# Patient Record
Sex: Female | Born: 1965
Health system: Southern US, Community
[De-identification: ages and names within clinical notes are randomized; demographics above are authoritative.]

## PROBLEM LIST (undated history)

## (undated) DIAGNOSIS — S62102A Fracture of unspecified carpal bone, left wrist, initial encounter for closed fracture: Secondary | ICD-10-CM

## (undated) DIAGNOSIS — M419 Scoliosis, unspecified: Secondary | ICD-10-CM

## (undated) DIAGNOSIS — F325 Major depressive disorder, single episode, in full remission: Secondary | ICD-10-CM

## (undated) DIAGNOSIS — I8390 Asymptomatic varicose veins of unspecified lower extremity: Secondary | ICD-10-CM

## (undated) DIAGNOSIS — F419 Anxiety disorder, unspecified: Secondary | ICD-10-CM

## (undated) DIAGNOSIS — G4730 Sleep apnea, unspecified: Secondary | ICD-10-CM

## (undated) DIAGNOSIS — T8859XA Other complications of anesthesia, initial encounter: Secondary | ICD-10-CM

## (undated) DIAGNOSIS — Z9889 Other specified postprocedural states: Secondary | ICD-10-CM

## (undated) DIAGNOSIS — R112 Nausea with vomiting, unspecified: Secondary | ICD-10-CM

## (undated) HISTORY — DX: Scoliosis, unspecified: M41.9

## (undated) HISTORY — PX: PANNICULECTOMY: SUR1001

## (undated) HISTORY — DX: Asymptomatic varicose veins of unspecified lower extremity: I83.90

## (undated) HISTORY — PX: APPENDECTOMY: SHX54

## (undated) HISTORY — PX: CERVICAL FUSION: SHX112

## (undated) HISTORY — DX: Major depressive disorder, single episode, in full remission: F32.5

## (undated) HISTORY — PX: CHOLECYSTECTOMY: SHX55

---

## 1999-04-20 ENCOUNTER — Other Ambulatory Visit: Admission: RE | Admit: 1999-04-20 | Discharge: 1999-04-20 | Payer: Self-pay | Admitting: Obstetrics and Gynecology

## 2000-06-30 ENCOUNTER — Other Ambulatory Visit: Admission: RE | Admit: 2000-06-30 | Discharge: 2000-06-30 | Payer: Self-pay | Admitting: Gynecology

## 2000-06-30 ENCOUNTER — Other Ambulatory Visit: Admission: RE | Admit: 2000-06-30 | Discharge: 2000-06-30 | Payer: Self-pay | Admitting: Obstetrics and Gynecology

## 2000-10-29 ENCOUNTER — Other Ambulatory Visit: Admission: RE | Admit: 2000-10-29 | Discharge: 2000-10-29 | Payer: Self-pay | Admitting: Obstetrics and Gynecology

## 2001-05-29 ENCOUNTER — Other Ambulatory Visit: Admission: RE | Admit: 2001-05-29 | Discharge: 2001-05-29 | Payer: Self-pay | Admitting: Obstetrics and Gynecology

## 2001-06-19 ENCOUNTER — Ambulatory Visit (HOSPITAL_COMMUNITY): Admission: RE | Admit: 2001-06-19 | Discharge: 2001-06-19 | Payer: Self-pay | Admitting: Orthopaedic Surgery

## 2001-06-19 ENCOUNTER — Encounter: Payer: Self-pay | Admitting: Orthopaedic Surgery

## 2001-11-30 ENCOUNTER — Other Ambulatory Visit: Admission: RE | Admit: 2001-11-30 | Discharge: 2001-11-30 | Payer: Self-pay | Admitting: Obstetrics and Gynecology

## 2002-08-17 ENCOUNTER — Other Ambulatory Visit: Admission: RE | Admit: 2002-08-17 | Discharge: 2002-08-17 | Payer: Self-pay | Admitting: Obstetrics and Gynecology

## 2003-11-15 ENCOUNTER — Ambulatory Visit (HOSPITAL_COMMUNITY): Admission: RE | Admit: 2003-11-15 | Discharge: 2003-11-15 | Payer: Self-pay | Admitting: Emergency Medicine

## 2003-11-15 ENCOUNTER — Emergency Department (HOSPITAL_COMMUNITY): Admission: EM | Admit: 2003-11-15 | Discharge: 2003-11-15 | Payer: Self-pay | Admitting: Emergency Medicine

## 2003-11-21 ENCOUNTER — Ambulatory Visit (HOSPITAL_COMMUNITY): Admission: RE | Admit: 2003-11-21 | Discharge: 2003-11-21 | Payer: Self-pay | Admitting: General Surgery

## 2003-11-30 ENCOUNTER — Other Ambulatory Visit: Admission: RE | Admit: 2003-11-30 | Discharge: 2003-11-30 | Payer: Self-pay | Admitting: Obstetrics and Gynecology

## 2004-10-09 ENCOUNTER — Other Ambulatory Visit: Admission: RE | Admit: 2004-10-09 | Discharge: 2004-10-09 | Payer: Self-pay | Admitting: Obstetrics and Gynecology

## 2008-03-14 ENCOUNTER — Other Ambulatory Visit: Admission: RE | Admit: 2008-03-14 | Discharge: 2008-03-14 | Payer: Self-pay | Admitting: Obstetrics and Gynecology

## 2008-06-09 ENCOUNTER — Emergency Department (HOSPITAL_COMMUNITY): Admission: EM | Admit: 2008-06-09 | Discharge: 2008-06-09 | Payer: Self-pay | Admitting: Emergency Medicine

## 2008-06-10 ENCOUNTER — Encounter (INDEPENDENT_AMBULATORY_CARE_PROVIDER_SITE_OTHER): Payer: Self-pay | Admitting: General Surgery

## 2008-06-11 ENCOUNTER — Inpatient Hospital Stay (HOSPITAL_COMMUNITY): Admission: EM | Admit: 2008-06-11 | Discharge: 2008-06-12 | Payer: Self-pay | Admitting: Emergency Medicine

## 2008-07-07 ENCOUNTER — Ambulatory Visit (HOSPITAL_COMMUNITY): Admission: RE | Admit: 2008-07-07 | Discharge: 2008-07-07 | Payer: Self-pay | Admitting: General Surgery

## 2008-07-12 ENCOUNTER — Ambulatory Visit: Payer: Self-pay | Admitting: Gastroenterology

## 2008-07-12 LAB — CONVERTED CEMR LAB
BUN: 11 mg/dL (ref 6–23)
Calcium: 8.3 mg/dL — ABNORMAL LOW (ref 8.4–10.5)
Chloride: 108 meq/L (ref 96–112)
Creatinine, Ser: 0.75 mg/dL (ref 0.40–1.20)
IgG (Immunoglobin G), Serum: 1320 mg/dL (ref 694–1618)

## 2008-07-13 ENCOUNTER — Encounter: Payer: Self-pay | Admitting: Gastroenterology

## 2008-07-21 ENCOUNTER — Ambulatory Visit (HOSPITAL_COMMUNITY): Admission: RE | Admit: 2008-07-21 | Discharge: 2008-07-21 | Payer: Self-pay | Admitting: Gastroenterology

## 2008-07-21 ENCOUNTER — Encounter: Payer: Self-pay | Admitting: Gastroenterology

## 2008-07-21 ENCOUNTER — Ambulatory Visit: Payer: Self-pay | Admitting: Gastroenterology

## 2008-08-16 ENCOUNTER — Ambulatory Visit: Payer: Self-pay | Admitting: Gastroenterology

## 2009-08-13 ENCOUNTER — Emergency Department (HOSPITAL_COMMUNITY): Admission: EM | Admit: 2009-08-13 | Discharge: 2009-08-13 | Payer: Self-pay | Admitting: Emergency Medicine

## 2009-11-15 ENCOUNTER — Ambulatory Visit: Payer: Self-pay | Admitting: Family Medicine

## 2009-11-15 LAB — CONVERTED CEMR LAB
Rapid Strep: NEGATIVE
WBC Urine, dipstick: NEGATIVE
pH: 5.5

## 2010-09-18 NOTE — Assessment & Plan Note (Signed)
Summary: RIGHT EYE /POSSIBLE PINK EYE   Vital Signs:  Patient Profile:   45 Years Old Female CC:      Rt eye pain, redness x 3 days  Dysuria x 4 days Height:     69 inches Weight:      165 pounds O2 Sat:      98 % O2 treatment:    Room Air Temp:     98.5 degrees F oral Pulse rate:   74 / minute Pulse rhythm:   regular Resp:     12 per minute BP sitting:   120 / 76  (right arm) Cuff size:   regular  Pt. in pain?   yes    Intensity:   2    Type:       burning  Vitals Entered By: Avel Sensor, CMA               Vision Screening: Left eye w/o correction: 20 / 15 Right Eye w/o correction: 20 / 20 Both eyes w/o correction:  20/ 15        Vision Entered By: Emilio Math (November 15, 2009 11:55 AM)    Prior Medication List:  No prior medications documented  Current Allergies: No known allergies History of Present Illness Chief Complaint: Rt eye pain, redness x 3 days  Dysuria x 4 days History of Present Illness: Patient has had a previous eye infection before she tried to use old antibiotic eye drops but they did not heolp and R eye has gotten worse.   She also usually gets one UTI a year and she has noticed some dysuria.  Current Problems: URINARY TRACT INFECTION (ICD-599.0) CONJUNCTIVITIS (ICD-372.30) ACUTE NASOPHARYNGITIS (ICD-460)   Current Meds CELEXA 10 MG TABS (CITALOPRAM HYDROBROMIDE)  DEPO-PROVERA 150 MG/ML SUSP (MEDROXYPROGESTERONE ACETATE)  CEFUROXIME AXETIL 500 MG  TABS (CEFUROXIME AXETIL) 1 by mouth 2 times daily DIFLUCAN 150 MG TABS (FLUCONAZOLE) once daily PYRIDIUM 200 MG TABS (PHENAZOPYRIDINE HCL) sig 1 by mouth by mouth q day  REVIEW OF SYSTEMS Constitutional Symptoms      Denies fever, chills, night sweats, weight loss, weight gain, and fatigue.  Eyes       Complains of eye pain and eye drainage.      Denies change in vision, glasses, contact lenses, and eye surgery. Ear/Nose/Throat/Mouth       Denies hearing loss/aids, change in hearing,  ear pain, ear discharge, dizziness, frequent runny nose, frequent nose bleeds, sinus problems, sore throat, hoarseness, and tooth pain or bleeding.  Respiratory       Denies dry cough, productive cough, wheezing, shortness of breath, asthma, bronchitis, and emphysema/COPD.  Cardiovascular       Denies murmurs, chest pain, and tires easily with exhertion.    Gastrointestinal       Denies stomach pain, nausea/vomiting, diarrhea, constipation, blood in bowel movements, and indigestion. Genitourniary       Complains of painful urination.      Denies kidney stones and loss of urinary control. Neurological       Denies paralysis, seizures, and fainting/blackouts. Musculoskeletal       Denies muscle pain, joint pain, joint stiffness, decreased range of motion, redness, swelling, muscle weakness, and gout.  Skin       Denies bruising, unusual mles/lumps or sores, and hair/skin or nail changes.  Psych       Denies mood changes, temper/anger issues, anxiety/stress, speech problems, depression, and sleep problems.  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 Mother, Diabetes, HTN, Hyperlidemia Father, UNK  Social History: Reviewed history and no changes required. 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 Physical Exam General appearance: well developed, well nourished, no acute distress Head: normocephalic, atraumatic Eyes: injected conjunctivae R  Ears: normal, no lesions or deformities Oral/Pharynx: pharyngeal erythema with exudate, uvula midline without deviation Neck: supple,anterior lymphadenopathy present Abdomen: soft w/ bowel  sounds Skin: no obvious rashes or lesions MSE: oriented to time, place, and person U?A unremarkable strep test was negative Assessment New Problems: URINARY TRACT INFECTION (ICD-599.0) CONJUNCTIVITIS (ICD-372.30) ACUTE NASOPHARYNGITIS (ICD-460)  possible uti               nasopharyngitis           conjunctivitis  Patient Education: Patient and/or caregiver instructed in the following: rest fluids and Tylenol.  Plan New Medications/Changes: PYRIDIUM 200 MG TABS (PHENAZOPYRIDINE HCL) sig 1 by mouth by mouth q day  #15 x 0, 11/15/2009, Hassan Rowan MD DIFLUCAN 150 MG TABS (FLUCONAZOLE) once daily  #1 x 0, 11/15/2009, Hassan Rowan MD CEFUROXIME AXETIL 500 MG  TABS (CEFUROXIME AXETIL) 1 by mouth 2 times daily  #20 x 0, 11/15/2009, Hassan Rowan MD  New Orders: New Patient Level IV [16109] UA Dipstick w/o Micro (manual) [81002] Rapid Strep [60454] Planning Comments:   ass below  Follow Up: Follow up in 2-3 days if no improvement, Follow up on an as needed basis, Follow up with Primary Physician  The patient and/or caregiver has been counseled thoroughly with regard to medications prescribed including dosage, schedule, interactions, rationale for use, and possible side effects and they verbalize understanding.  Diagnoses and expected course of recovery discussed and will return if not improved as expected or if the condition worsens. Patient and/or caregiver verbalized understanding.   PROCEDURE: Follow up: counciling done o ways to reduce recurrent UTI's Prescriptions: PYRIDIUM 200 MG TABS (PHENAZOPYRIDINE HCL) sig 1 by mouth by mouth q day  #15 x 0   Entered and Authorized by:   Hassan Rowan MD   Signed by:   Hassan Rowan MD on 11/15/2009   Method used:   Print then Give to Patient   RxID:   [id number] DIFLUCAN 150 MG TABS (FLUCONAZOLE) once daily  #1 x 0   Entered and Authorized by:   Hassan Rowan MD   Signed by:   Hassan Rowan MD on 11/15/2009   Method used:   Print then Give  to Patient   RxID:   [id number] CEFUROXIME AXETIL 500 MG  TABS (CEFUROXIME AXETIL) 1 by mouth 2 times daily  #20 x 0   Entered and Authorized by:   Hassan Rowan MD   Signed by:   Hassan Rowan MD on 11/15/2009   Method used:   Print then Give to Patient   RxID:   [id number]   Patient Instructions: 1)  Please schedule a follow-up appointment as needed. 2)  Please schedule an appointment with your primary doctor in :3-7 days 3)  Take your antibiotic as prescribed until ALL of it is gone, but stop if you develop a rash or swelling and contact our office as soon as possible. 4)  Clean any discharge from eyelids with baby shampoo and warm water. Be sure to wash your hands often  to avoid spreading and reinfection. If you wear contacts, remove them and wear glasses until infection resolved (be sure and clean lenses before replacing).   Laboratory Results   Urine Tests  Date/Time Received: November 15, 2009 11:56 AM  Date/Time Reported: November 15, 2009 11:56 AM   Routine Urinalysis   Color: straw Appearance: Hazy Glucose: negative   (Normal Range: Negative) Bilirubin: small   (Normal Range: Negative) Ketone: trace (5)   (Normal Range: Negative) Spec. Gravity: >=1.030   (Normal Range: 1.003-1.035) Blood: trace-intact   (Normal Range: Negative) pH: 5.5   (Normal Range: 5.0-8.0) Protein: 30   (Normal Range: Negative) Urobilinogen: 0.2   (Normal Range: 0-1) Nitrite: negative   (Normal Range: Negative) Leukocyte Esterace: negative   (Normal Range: Negative)    Date/Time Received: November 15, 2009 1:22 PM  Date/Time Reported: November 15, 2009 1:22 PM   Other Tests  Rapid Strep: negative

## 2010-11-19 LAB — URINE MICROSCOPIC-ADD ON

## 2010-11-19 LAB — URINALYSIS, ROUTINE W REFLEX MICROSCOPIC
Leukocytes, UA: NEGATIVE
Nitrite: NEGATIVE
Specific Gravity, Urine: >1.03 — ABNORMAL HIGH (ref 1.005–1.030)
Urobilinogen, UA: 0.2 mg/dL (ref 0.0–1.0)

## 2011-01-01 NOTE — Op Note (Signed)
NAMETRISTINE, LANGI NO.:  [id number]   MEDICAL RECORD NO.:  [id number]          PATIENT TYPE:  OBV   LOCATION:  A326                          FACILITY:  [facility]   PHYSICIAN:  Dalia Heading, M.D.  DATE OF BIRTH:  1966-01-24   DATE OF PROCEDURE:  06/10/2008  DATE OF DISCHARGE:                               OPERATIVE REPORT   PREOPERATIVE DIAGNOSIS:  Perforated appendicitis.   POSTOPERATIVE DIAGNOSIS:  Perforated appendicitis.   PROCEDURE:  Laparoscopic appendectomy.   SURGEON:  Dalia Heading, MD   ANESTHESIA:  General endotracheal.   INDICATIONS:  The patient is a 45 year old white female presents with  perforated appendicitis as confirmed by CT scan of the abdomen.  There  is free air noted around the inflamed appendix with free fluid noted in  the pelvis.  The risks and benefits of the procedure including bleeding,  infection, and possibility of an open procedure were fully explained to  the patient, gave informed consent.   PROCEDURE NOTE:  The patient was placed in the supine position.  After  induction of general endotracheal anesthesia, the abdomen was prepped  and draped in the usual sterile technique with Betadine.  Surgical site  confirmation was performed.   A supraumbilical incision was made down to fascia.  A Veress needle was  then introduced into the abdominal cavity and confirmation of placement  was done using the saline drop test.  The abdomen was then insufflated  to 16 mmHg pressure.  An 11-mm trocar was introduced into the abdominal  cavity under direct visualization without difficulty.  The patient was  placed in deeper Trendelenburg position.  An additional 11-mm trocar was  placed in the suprapubic region and a 5-mm trocar was placed in the left  lower quadrant region.  The appendix was visualized and noted to be  perforated with a appendolith noted in the midportion of the appendix.  In order to facilitate the dissection, the  mesoappendix was divided at  the base of the appendix and a vascular Endo-GIA was placed across the  base of the appendix and fired.  The appendix was then freed away from  the mesentery along the right lower quadrant region of the ileocecal  region using the harmonic scalpel.  The appendix was then removed in  total without difficulty.  The pelvis and right lower quadrant were then  copiously irrigated with normal saline.  Cloudy fluid was found, but no  discrete abscess was found.  A #10 flat Jackson-Pratt drain was placed  into the right lower quadrant and brought through the 5-mm trocar site.  All fluid and air were then evacuated from the abdominal cavity prior to  removal of the trocars.   All wounds were irrigated with normal saline.  All wounds were injected  with 0.5% Sensorcaine.  The supraumbilical fascia as well as suprapubic  fascia were reapproximated using 0 Vicryl interrupted sutures.  This JP  drain was secured in place at skin level using a 3-0 nylon interrupted  suture.  All skin incisions were then closed using staples.  All tape and needle counts were correct at the end of procedure.  The  patient was extubated in the operating room and went back to recovery  room awake in stable condition.   COMPLICATIONS:  None.   SPECIMEN:  Appendix.   BLOOD LOSS:  Minimal.   DRAINS:  Jackson-Pratt drain to right lower quadrant.      Dalia Heading, M.D.  Electronically Signed     MAJ/MEDQ  D:  06/10/2008  T:  06/11/2008  Job:  [id number]   cc:   Kirk Ruths, M.D.  Fax: [phone]

## 2011-01-01 NOTE — Assessment & Plan Note (Signed)
NAMEMarland Kitchen  Heather Davis, Davis                  CHART#:  [id number]   DATE:  08/16/2008                       DOB:  [birth year]   REFERRING PHYSICIAN:  Dalia Heading, MD   PROBLEM LIST:  1. Clostridium difficile colitis following hospitalization for      gangrenous appendicitis/appendectomy.  2. Depression   SUBJECTIVE:  The patient is a 45 year old female who presents as a  return patient visit.  She was last seen for her colonoscopy in December  2009.  She had acute colitis with transmural necrosis consistent with  pseudomembranous colitis.  The Cipro with discontinued and she was  placed on Flagyl alone for 2 weeks.  Her last dose of Flagyl was  approximately 2 days before Christmas.  She said the Flagyl gave her a  bad taste in her mouth.  Her stools are formed usually once a day,  sometimes it dissolves in the water.  She also complains of rectal  itching.  She denies any fever, chills, or abdominal pain.  Her appetite  is returning since been off the antibiotics.  She did not take the  Saccharomyces boulardii because it was not available at the pharmacy.   MEDICATIONS:  1. Celexa 20 mg daily.  2. Birth control pills daily.   OBJECTIVE:  VITAL SIGNS:  Weight 155 pounds (down 8 pounds since  November 2009), height 5 feet 9 inches, BMI 22.9 (healthy), temperature  98.1, blood pressure 128/78, and pulse 16.GENERAL:  She is in no  apparent distress.  Alert and oriented x4.HEENT:  Atraumatic,  normocephalic.  Pupils equal and react to light.  Mouth, no oral  lesions.  Posterior pharynx without erythema or exudate.NECK:  Full  range of motion and no lymphadenopathy.LUNGS:  Clear to auscultation  bilaterally.CARDIOVASCULAR:  Regular rhythm.  No murmur.  Normal S1 and  S2.ABDOMEN:  Bowel sounds are present.  Soft, nontender, nondistended.  No rebound or guarding.  EXTREMITIES:  Have no cyanosis or edema. RECTAL:  Small hemorrhoid  present externally.  No surrounding erythema.  Nontender.   Exam with  tension placed on the anal canal.  Digital exam deferred.   ASSESSMENT:  The patient is a 45 year old female who most likely has  rectal itching from hemorrhoids.  Her Clostridium difficile colitis  seems to be resolved.  Thank you for allowing me to see the patient in  consultation.  My recommendations follow.   RECOMMENDATIONS:  1. I explained her that it is okay to take calcium.  She should add      Align intake 1 daily for the next 3 months.  If it continues to      make her feel better, then she can take it indefinitely.  She is      given information on Align and the coupon.  2. If she starts antibiotics, she should take Flagyl and continue      Flagyl 10 days beyond the stop date for the other antibiotic.  3. She is given a prescription for [facility] and asked to use it 4      times a day to her rectum for the next 10 days.  4. She may follow up with me as needed.  She should have a screening      colonoscopy at age 61.  Kassie Mends, M.D.  Electronically Signed     SM/MEDQ  D:  08/16/2008  T:  08/16/2008  Job:  [id number]   cc:   Dalia Heading, M.D.  Kirk Ruths, M.D.

## 2011-01-01 NOTE — Op Note (Signed)
NAMETINLEY, ROUGHT                 ACCOUNT NO.:  [id number]   MEDICAL RECORD NO.:  [id number]          PATIENT TYPE:  AMB   LOCATION:  DAY                           FACILITY:  [facility]   PHYSICIAN:  Kassie Mends, M.D.      DATE OF BIRTH:  [birth year]   DATE OF PROCEDURE:  07/21/2008  DATE OF DISCHARGE:                               OPERATIVE REPORT   REFERRING PHYSICIAN:  Kirk Ruths, MD   PROCEDURE:  Ileocolonoscopy with cold forceps biopsies.   INDICATION FOR EXAMINATION:  Ms. Joseph Art is a 45 year old female who was  seen for diarrhea in November 2009.  She had a perforated gangrenous  acute appendicitis and was hospitalized in October for 2 days.  She  developed diarrhea and was placed on Flagyl by Dr. Lovell Sheehan, but did not  see any improvement.  Stool studies were obtained by Dr. Regino Schultze and she  was sent to see me for an evaluation.  During the workup, her stool  studies revealed a positive C. diff toxin.  She was started on Cipro and  Flagyl.  She presents for evaluation of her diarrhea.   FINDINGS:  1. Diffuse colitis beginning in the proximal rectum and extending to      the cecum.  The inflammatory changes are most pronounced in the      sigmoid colon and the proximal rectum.  Pseudomembranes were seen      from the proximal rectum to the transverse colon.  Biopsies were      obtained via cold forceps.  Otherwise, no polyps, masses, or AVMs      seen.  No diverticula.  2. Normal terminal ileum, approximately 10 cm visualized.  3. Normal retroflexed view of the rectum.   DIAGNOSIS:  Diffuse colitis, likely secondary to partially treated  Clostridium difficile colitis.   RECOMMENDATIONS:  1. She should stop the Bentyl and use it as needed for cramping and      diarrhea.  2. She should stop the Cipro.  She should continue Flagyl 500 mg one 3      times a day for 10 days.  She is given a prescription.  3. She should follow a lactose-free diet for the next 2 weeks.  She    may also have a high-fiber diet.  She was given a handout on both      diet.  4. No aspirin or NSAIDs for 14 days.  No anticoagulation for 5 days.  5. Follow up appointment in 3 weeks with Dr. Cira Servant regarding her      diarrhea.  6. Add Saccharomyces boulardii 1 g b.i.d. for 10 days.   MEDICATIONS:  1. Demerol 125 mg IV.  2. Versed 7 mg IV.   PROCEDURE TECHNIQUE:  Physical exam was performed.  Informed consent was  obtained from the patient after explaining the benefits, risks, and  alternatives to the procedure.  The patient was connected to the monitor  and placed in the left lateral position.  Continuous oxygen was provided  by nasal cannula, IV medicine administered through an indwelling  cannula.  After administration of sedation and rectal exam, the  patient's rectum was intubated.  The scope was advanced under direct  visualization to the distal terminal ileum.  The scope was removed  slowly by carefully examining the color, texture, anatomy, and integrity  of the mucosa on the way out.  The patient was recovered in endoscopy  and discharged home in satisfactory condition.   PATH:  c DIFF COLITIS.      Kassie Mends, M.D.  Electronically Signed     SM/MEDQ  D:  07/21/2008  T:  07/22/2008  Job:  [id number]   cc:   Kirk Ruths, M.D.  Fax: [phone]

## 2011-01-01 NOTE — Discharge Summary (Signed)
Heather Davis, Heather Davis NO.:  [id number]   MEDICAL RECORD NO.:  [id number]          PATIENT TYPE:  INP   LOCATION:  A326                          FACILITY:  [facility]   PHYSICIAN:  Dalia Heading, M.D.  DATE OF BIRTH:  [birth year]   DATE OF ADMISSION:  06/10/2008  DATE OF DISCHARGE:  10/25/2009LH                               DISCHARGE SUMMARY   HOSPITAL COURSE SUMMARY:  The patient is a 45 year old white female who  presented to the emergency room with worsening right lower quadrant  abdominal pain.  CT scan of the abdomen and pelvis revealed acute  appendicitis with perforation.  No abscess was seen.  The patient was  taken to the operating room and underwent laparoscopic appendectomy.  She tolerated the procedure well.  Her postoperative course has been  unremarkable.  Her diet was advanced without difficulty.  Her white  blood cell count was normal at the time of discharge.  Her hypokalemia  had resolved.   The patient is being discharged home on postoperative day #2 in good and  improving condition.   DISCHARGE INSTRUCTIONS:  The patient is to follow up with Dr. Franky Macho on June 16, 2008.  She is to drain her bulb suction 3 times a  day.   DISCHARGE MEDICATIONS:  1. Percocet 7.5 mg 1-2 tablets p.o. q.4 h. p.r.n. pain.  2. Augmentin 875 mg p.o. b.i.d. x10 days.   PRINCIPAL DIAGNOSES:  1. Acute appendicitis with perforation.  2. Hypokalemia, resolved.   PRINCIPAL PROCEDURE:  Laparoscopic appendectomy on June 10, 2008.      Dalia Heading, M.D.  Electronically Signed     MAJ/MEDQ  D:  06/12/2008  T:  06/12/2008  Job:  [id number]   cc:   Kirk Ruths, M.D.  Fax: [phone]

## 2011-01-01 NOTE — Consult Note (Signed)
NAMEMAKAYLEN, Heather Davis                 ACCOUNT NO.:  [id number]   MEDICAL RECORD NO.:  [id number]          PATIENT TYPE:  AMB   LOCATION:  DAY                           FACILITY:  [facility]   PHYSICIAN:  Kassie Mends, M.D.      DATE OF BIRTH:  [birth year]   DATE OF CONSULTATION:  07/12/2008  DATE OF DISCHARGE:                                 CONSULTATION   REFERRING PHYSICIAN:  Kirk Ruths, M.D.   REASON FOR CONSULTATION:  Diarrhea.   HISTORY OF PRESENT ILLNESS:  Ms. Heather Davis is a 45 year old female who had a  cholecystectomy in 2004 due to nausea, vomiting, and abdominal pain.  She did not have diarrhea following her cholecystectomy.  She has had  regular and normal stools until she had a perforated appendix in October  2009.  Pathology showed transmural gangrenous acute appendicitis.  She  has been having anywhere from 6-8 bowel movements a day.  She went to  having soft and loose stools daily.  She now has to know where every  bathroom is in [place].  She used to drink a gallon of milk a week  before her surgery, but has cut down on her milk consumption due to her  diarrhea and due to her loose stool.  She rarely eats cheese and does  not eat ice cream.  She does have well water.  She had stool studies  performed this morning.  She was placed on Flagyl for 10 days, but did  not see any improvement.  She had a CT scan last Thursday which showed  no acute intra-abdominal process.  She has been on a bland diet and has  lost weight.  Sunday, she had 8 stools; Monday, she had 6 stools; and on  today, she has had 4.  She denies any blood in her stool.  She was put  on Bentyl and has been taking it twice a day, but has only had 3 pills.  It does not seem to be helping.  She does have abdominal pain prior to  having bowel movements and then after bowel movements, the pain has  improved.  She has only vomited once.  She denies any heartburn or  indigestion.  She has not been visiting any  nursing homes.  She was  hospitalized for 2 days in October 2009.  Initially, when she has a  bowel movement, she has a lot of gas and then the stool comes.  She does  not have any problems swallowing.  She has no new stressors.   PAST MEDICAL HISTORY:  Depression.   PAST SURGICAL HISTORY:  C-section in 1992 and 1994.   ALLERGIES:  No known drug allergies.   MEDICATIONS:  1. Celexa 20 mg daily.  2. Birth control pills daily.  3. Bentyl b.i.d.   FAMILY HISTORY:  She denies any family history of inflammatory bowel  disease, diarrheal illnesses, colon cancer, or colon polyps.   SOCIAL HISTORY:  She is divorced and works in the [employer], and does  not smoke.  She has occasionally drinks [employer] twice  a month, but  has had rare alcohol consumption since October 2009.   REVIEW OF SYSTEMS:  Her last menstrual period was on June 18, 2008,  and she has had no black tarry stools.  The review of systems per the  HPI, otherwise all as previously mentioned and otherwise all systems are  negative.   ASSESSMENT:  Ms. Heather Davis is a 45 year old female who has symptoms that  sound consistent with irritable bowel syndrome-diarrhea predominant.  However, the differential diagnosis includes microscopic colitis, celiac  sprue, thyroid disease, and a low likelihood of giardiasis.  She may  also have lactose intolerance. Thank you for allowing me to see Ms.  Davis in consultation. My recommendations are as follow.   RECOMMENDATIONS:  1. Will check a TSH and Giardia antigen today.  She also will have a      BNP because she is complaining of weakness and her potassium should      be evaluated.  2. Will check a tissue transglutaminase IgA and a quantitative      immunoglobulins.  3. She was asked to increase her Bentyl to 10 mg q.a.c. and nightly.      She should avoid lactose.  She was given a handout on lactose-free      diet.  4. She was scheduled for colonoscopy next week with the MiraLax  bowel      prep.  5. She is to add probiotics.  She was given 16 pills of Digestive      Advantage Lactose Intolerance.  6. She will have a follow up appointment to see me in 2 months.      Kassie Mends, M.D.  Electronically Signed     SM/MEDQ  D:  07/12/2008  T:  07/13/2008  Job:  [id number]   cc:   Kirk Ruths, M.D.  Fax: [phone]

## 2011-01-04 NOTE — H&P (Signed)
NAMEYOSHIKO, KELEHER NO.:  [id number]   MEDICAL RECORD NO.:  [id number]                  PATIENT TYPE:   LOCATION:                                       FACILITY:   PHYSICIAN:  Dalia Heading, M.D.               DATE OF BIRTH:  [birth year]   DATE OF ADMISSION:  DATE OF DISCHARGE:                                HISTORY & PHYSICAL   CHIEF COMPLAINT:  Cholecystitis, cholelithiasis.   HISTORY OF PRESENT ILLNESS:  The patient is a 45 year old white female who  is referred for evaluation and treatment of cholecystitis secondary to  cholelithiasis.  She has been having episodes of right upper quadrant  abdominal pain radiating to the right flank, nausea, and bloating for  several weeks.  There is a questionable history of fatty food intolerance.  No fever, chills or jaundice have been noted. She was seen in the emergency  room earlier today for these symptoms, but feels much better and is  currently hungry.   PAST MEDICAL HISTORY:  Unremarkable.   PAST SURGICAL HISTORY:  C-sections x2.   CURRENT MEDICATIONS:  Birth control pills and Xanax.   ALLERGIES:  No known drug allergies.   REVIEW OF SYSTEMS:  Noncontributory.   PHYSICAL EXAMINATION:  GENERAL:  On physical examination, the patient is a  well-developed well-nourished white female in no acute distress.  VITAL SIGNS:  She is afebrile and vital signs are stable.  HEENT:  Examination reveals no scleral icterus.  LUNGS:  Clear to auscultation with equal breath sounds bilaterally.  HEART:  Heart examination reveals a regular rate and rhythm without S3, S4,  or murmurs.  ABDOMEN:  The abdomen is soft with slight tenderness noted in the right  upper quadrant to palpation.  No hepatosplenomegaly, masses, or hernias are  identified.   Ultrasound of the gallbladder reveals cholelithiasis with a normal common  bile duct.  Liver enzyme tests, amylase, lipase, and CBC are all within  normal limits.   IMPRESSION:  Cholecystitis, cholelithiasis.   PLAN:  The patient is scheduled for laparoscopic cholecystectomy on November 21, 2003.  The risks and benefits of the procedure including bleeding,  infection, hepatobiliary injury, and the possibility of an open procedure  were fully explained to the patient, who gave informed consent.  Vicodin and  Phenergan have been prescribed preoperatively.     ___________________________________________                                         Dalia Heading, M.D.   MAJ/MEDQ  D:  11/15/2003  T:  11/15/2003  Job:  [id number]   cc:   Dalia Heading, M.D.  [address], [place]  [place]  [place] [address]  Fax: [phone]   Kirk Ruths, M.D.  [address]  [place]  [place] [address]  Fax: [phone]

## 2011-01-04 NOTE — Op Note (Signed)
NAME:  Heather Davis, Heather Davis                         ACCOUNT NO.:  [id number]   MEDICAL RECORD NO.:  [id number]                   PATIENT TYPE:  AMB   LOCATION:  DAY                                  FACILITY:  [facility]   PHYSICIAN:  Dalia Heading, M.D.               DATE OF BIRTH:  03/04/1966   DATE OF PROCEDURE:  11/21/2003  DATE OF DISCHARGE:                                 OPERATIVE REPORT   PREOPERATIVE DIAGNOSIS:  Cholecystitis, cholelithiasis.   POSTOPERATIVE DIAGNOSIS:  Cholecystitis, cholelithiasis.   PROCEDURE:  Laparoscopic cholecystectomy.   SURGEON:  Dalia Heading, M.D.   ASSISTANT:  Bernerd Limbo. Leona Carry, M.D.   ANESTHESIA:  General endotracheal.   INDICATIONS FOR PROCEDURE:  The patient is a 45 year old white female who  presents with cholecystitis secondary to cholelithiasis.  The risks and  benefits of the procedure, including bleeding, infection, hepatobiliary  injury, and the possibility of an open procedure were fully explained to the  patient who gave informed consent.   DESCRIPTION OF PROCEDURE:  The patient was placed in the supine position.  After induction of general endotracheal anesthesia, the abdomen was prepped  and draped using the usual sterile technique with Betadine.  Surgical site  confirmation was performed.   A supraumbilical incision was made down to the fascia.  A Veress needle was  introduced into the abdominal cavity, and confirmation of placement was done  using the saline drop test.  The abdomen was then insufflated to 16 mmHg  pressure.  An 11-mm trocar was introduced into the abdominal cavity under  direct visualization without difficulty.  The patient was placed in reverse  Trendelenburg position, and an additional 11-mm trocar was placed in the  epigastric region and 5-mm trocars were placed in the right upper quadrant  and right flank regions.  The liver was inspected and noted to be within  normal limits.  The gallbladder was retracted  superiorly and laterally.  The  dissection was begun around the infundibulum of the gallbladder.  The cystic  duct was first identified.  Its juncture to the infundibulum was fully  identified.  Endoclips were placed proximally and distally on the cystic  duct, and the cystic duct was divided.  This was likewise done on the cystic  artery.  The gallbladder was then freed away from the gallbladder fossa  using Bovie electrocautery.  The gallbladder was delivered through the  epigastric trocar site using an EndoCatch bag.  The gallbladder fossa was  inspected, and no abnormal bleeding or bile leakage was noted.  Surgicel was  placed in the gallbladder fossa.  The subhepatic space as well as right  hepatic gutter were irrigated with normal saline.  All fluid and air were  then evacuated from the abdominal cavity prior to removal of the trocars.   All wounds were irrigated with normal saline.  All wounds were injected with  0.5% Sensorcaine.  The supraumbilical fascia was reapproximated using an 0  Vicryl interrupted suture.  All skin incisions were closed using staples.  Betadine ointment and dry sterile dressings were applied.   All tape and needle counts were correct at the end of the procedure.  The  patient was extubated in the operating room and went back to the recovery  room awake and in stable condition.   COMPLICATIONS:  None.   SPECIMENS:  Gallbladder with stones.   ESTIMATED BLOOD LOSS:  Minimal.      ___________________________________________                                            Dalia Heading, M.D.   MAJ/MEDQ  D:  11/21/2003  T:  11/21/2003  Job:  [id number]   cc:   Kirk Ruths, M.D.  [address]  [place]  [place] [address]  Fax: [phone]

## 2011-05-20 LAB — DIFFERENTIAL
Band Neutrophils: 12 — ABNORMAL HIGH
Basophils Absolute: 0
Basophils Absolute: 0
Basophils Absolute: 0
Basophils Relative: 0
Basophils Relative: 0
Basophils Relative: 0
Blasts: 0
Eosinophils Absolute: 0
Eosinophils Absolute: 0
Eosinophils Absolute: 0.1
Eosinophils Absolute: 0.2
Eosinophils Relative: 0
Eosinophils Relative: 0
Eosinophils Relative: 0
Eosinophils Relative: 2
Lymphocytes Relative: 0 — ABNORMAL LOW
Lymphocytes Relative: 13
Lymphocytes Relative: 13
Lymphs Abs: 0 — ABNORMAL LOW
Lymphs Abs: 1.2
Lymphs Abs: 1.4
Lymphs Abs: 1.5
Metamyelocytes Relative: 0
Monocytes Absolute: 0.4
Monocytes Absolute: 0.6
Monocytes Absolute: 0.8
Monocytes Relative: 4
Monocytes Relative: 5
Monocytes Relative: 5
Monocytes Relative: 6
Myelocytes: 0
Neutro Abs: 13.8 — ABNORMAL HIGH
Neutro Abs: 7.5
Neutro Abs: 9.5 — ABNORMAL HIGH
Neutrophils Relative %: 79 — ABNORMAL HIGH
Neutrophils Relative %: 83 — ABNORMAL HIGH
Neutrophils Relative %: 84 — ABNORMAL HIGH
Promyelocytes Absolute: 0
nRBC: 0

## 2011-05-20 LAB — CBC
HCT: 29.8 — ABNORMAL LOW
HCT: 37.2
Hemoglobin: 10.3 — ABNORMAL LOW
Hemoglobin: 12.7
MCHC: 33.7
MCHC: 33.7
MCHC: 34.2
MCHC: 34.4
MCV: 93.1
MCV: 93.8
MCV: 94
Platelets: 196
Platelets: 200
RBC: 3.2 — ABNORMAL LOW
RBC: 4.36
RDW: 13.1
RDW: 13.1
WBC: 11.3 — ABNORMAL HIGH
WBC: 13.1 — ABNORMAL HIGH
WBC: 16.6 — ABNORMAL HIGH

## 2011-05-20 LAB — COMPREHENSIVE METABOLIC PANEL WITH GFR
ALT: 15
AST: 17
Albumin: 3.6
Alkaline Phosphatase: 41
BUN: 10
CO2: 22
Calcium: 8.7
Chloride: 108
Creatinine, Ser: 0.69
GFR calc non Af Amer: >60
Glucose, Bld: 115 — ABNORMAL HIGH
Potassium: 3.5
Sodium: 138
Total Bilirubin: 0.9
Total Protein: 6.7

## 2011-05-20 LAB — BASIC METABOLIC PANEL
CO2: 22
Chloride: 104
Chloride: 109
Creatinine, Ser: 0.52
GFR calc non Af Amer: >60
Potassium: 2.7 — CL
Sodium: 135
Sodium: 138

## 2011-05-20 LAB — URINE MICROSCOPIC-ADD ON

## 2011-05-20 LAB — URINALYSIS, ROUTINE W REFLEX MICROSCOPIC
Glucose, UA: 100 — AB
Glucose, UA: NEGATIVE
Hgb urine dipstick: NEGATIVE
Hgb urine dipstick: NEGATIVE
Ketones, ur: 15 — AB
Leukocytes, UA: NEGATIVE
Specific Gravity, Urine: 1.025
pH: 5
pH: 6

## 2011-05-20 LAB — PREGNANCY, URINE

## 2011-05-20 LAB — LIPASE, BLOOD: Lipase: 23

## 2012-09-01 ENCOUNTER — Other Ambulatory Visit: Payer: Self-pay | Admitting: Obstetrics and Gynecology

## 2012-09-01 DIAGNOSIS — R928 Other abnormal and inconclusive findings on diagnostic imaging of breast: Secondary | ICD-10-CM

## 2012-09-23 ENCOUNTER — Other Ambulatory Visit (HOSPITAL_COMMUNITY): Payer: Self-pay | Admitting: Obstetrics and Gynecology

## 2012-09-23 ENCOUNTER — Ambulatory Visit: Admission: RE | Admit: 2012-09-23 | Payer: Self-pay | Source: Ambulatory Visit

## 2012-09-23 ENCOUNTER — Ambulatory Visit (HOSPITAL_COMMUNITY)
Admission: RE | Admit: 2012-09-23 | Discharge: 2012-09-23 | Disposition: A | Discharge status: Home / Self Care | Payer: PRIVATE HEALTH INSURANCE | Source: Ambulatory Visit | Attending: Obstetrics and Gynecology | Admitting: Obstetrics and Gynecology

## 2012-09-23 DIAGNOSIS — N6320 Unspecified lump in the left breast, unspecified quadrant: Secondary | ICD-10-CM

## 2012-09-23 DIAGNOSIS — R928 Other abnormal and inconclusive findings on diagnostic imaging of breast: Secondary | ICD-10-CM | POA: Insufficient documentation

## 2013-03-03 ENCOUNTER — Other Ambulatory Visit: Payer: Self-pay | Admitting: Obstetrics and Gynecology

## 2013-03-03 DIAGNOSIS — N649 Disorder of breast, unspecified: Secondary | ICD-10-CM

## 2013-03-24 ENCOUNTER — Ambulatory Visit
Admission: RE | Admit: 2013-03-24 | Discharge: 2013-03-24 | Disposition: A | Discharge status: Home / Self Care | Payer: PRIVATE HEALTH INSURANCE | Source: Ambulatory Visit | Attending: Obstetrics and Gynecology | Admitting: Obstetrics and Gynecology

## 2013-03-24 DIAGNOSIS — N649 Disorder of breast, unspecified: Secondary | ICD-10-CM

## 2013-09-16 ENCOUNTER — Other Ambulatory Visit: Payer: Self-pay | Admitting: Obstetrics and Gynecology

## 2014-10-27 ENCOUNTER — Other Ambulatory Visit: Payer: Self-pay | Admitting: Obstetrics and Gynecology

## 2014-10-28 LAB — CYTOLOGY - PAP

## 2015-05-18 ENCOUNTER — Other Ambulatory Visit: Payer: Self-pay | Admitting: Orthopaedic Surgery

## 2015-05-18 DIAGNOSIS — R5381 Other malaise: Secondary | ICD-10-CM

## 2015-06-01 ENCOUNTER — Other Ambulatory Visit: Payer: Self-pay | Admitting: Orthopaedic Surgery

## 2015-06-01 DIAGNOSIS — M4120 Other idiopathic scoliosis, site unspecified: Secondary | ICD-10-CM

## 2015-06-07 ENCOUNTER — Other Ambulatory Visit: Payer: PRIVATE HEALTH INSURANCE

## 2015-06-12 ENCOUNTER — Ambulatory Visit (HOSPITAL_COMMUNITY)
Admission: RE | Admit: 2015-06-12 | Discharge: 2015-06-12 | Disposition: A | Discharge status: Home / Self Care | Payer: PRIVATE HEALTH INSURANCE | Source: Ambulatory Visit | Attending: Orthopaedic Surgery | Admitting: Orthopaedic Surgery

## 2015-06-12 DIAGNOSIS — M4120 Other idiopathic scoliosis, site unspecified: Secondary | ICD-10-CM | POA: Insufficient documentation

## 2015-07-03 ENCOUNTER — Other Ambulatory Visit (HOSPITAL_COMMUNITY): Payer: Self-pay | Admitting: Orthopaedic Surgery

## 2015-07-03 DIAGNOSIS — M4120 Other idiopathic scoliosis, site unspecified: Secondary | ICD-10-CM

## 2015-07-18 ENCOUNTER — Other Ambulatory Visit: Payer: Self-pay | Admitting: Obstetrics & Gynecology

## 2015-08-29 ENCOUNTER — Ambulatory Visit (HOSPITAL_COMMUNITY)
Admission: RE | Admit: 2015-08-29 | Discharge: 2015-08-29 | Disposition: A | Discharge status: Home / Self Care | Payer: Federal, State, Local not specified - PPO | Source: Ambulatory Visit | Attending: Orthopaedic Surgery | Admitting: Orthopaedic Surgery

## 2015-08-29 DIAGNOSIS — M545 Low back pain: Secondary | ICD-10-CM | POA: Diagnosis present

## 2015-08-29 DIAGNOSIS — M4120 Other idiopathic scoliosis, site unspecified: Secondary | ICD-10-CM

## 2015-08-29 DIAGNOSIS — M50223 Other cervical disc displacement at C6-C7 level: Secondary | ICD-10-CM | POA: Insufficient documentation

## 2015-08-29 DIAGNOSIS — M419 Scoliosis, unspecified: Secondary | ICD-10-CM | POA: Insufficient documentation

## 2015-08-29 DIAGNOSIS — M5126 Other intervertebral disc displacement, lumbar region: Secondary | ICD-10-CM | POA: Diagnosis not present

## 2015-08-29 DIAGNOSIS — M546 Pain in thoracic spine: Secondary | ICD-10-CM | POA: Insufficient documentation

## 2015-08-29 DIAGNOSIS — M5124 Other intervertebral disc displacement, thoracic region: Secondary | ICD-10-CM | POA: Insufficient documentation

## 2015-09-20 HISTORY — PX: BACK SURGERY: SHX140

## 2015-12-25 DIAGNOSIS — Z6826 Body mass index (BMI) 26.0-26.9, adult: Secondary | ICD-10-CM | POA: Diagnosis not present

## 2015-12-25 DIAGNOSIS — Z01419 Encounter for gynecological examination (general) (routine) without abnormal findings: Secondary | ICD-10-CM | POA: Diagnosis not present

## 2015-12-25 DIAGNOSIS — Z1231 Encounter for screening mammogram for malignant neoplasm of breast: Secondary | ICD-10-CM | POA: Diagnosis not present

## 2015-12-26 DIAGNOSIS — Z01419 Encounter for gynecological examination (general) (routine) without abnormal findings: Secondary | ICD-10-CM | POA: Diagnosis not present

## 2015-12-28 ENCOUNTER — Other Ambulatory Visit: Payer: Self-pay | Admitting: Obstetrics & Gynecology

## 2015-12-28 DIAGNOSIS — R928 Other abnormal and inconclusive findings on diagnostic imaging of breast: Secondary | ICD-10-CM

## 2016-01-03 ENCOUNTER — Other Ambulatory Visit (HOSPITAL_COMMUNITY): Payer: Self-pay | Admitting: Orthopaedic Surgery

## 2016-01-03 DIAGNOSIS — M4712 Other spondylosis with myelopathy, cervical region: Secondary | ICD-10-CM | POA: Diagnosis not present

## 2016-01-03 DIAGNOSIS — M542 Cervicalgia: Secondary | ICD-10-CM

## 2016-01-03 DIAGNOSIS — Z4789 Encounter for other orthopedic aftercare: Secondary | ICD-10-CM | POA: Diagnosis not present

## 2016-01-03 DIAGNOSIS — M4324 Fusion of spine, thoracic region: Secondary | ICD-10-CM | POA: Diagnosis not present

## 2016-01-03 DIAGNOSIS — M4125 Other idiopathic scoliosis, thoracolumbar region: Secondary | ICD-10-CM | POA: Diagnosis not present

## 2016-01-03 DIAGNOSIS — M4326 Fusion of spine, lumbar region: Secondary | ICD-10-CM | POA: Diagnosis not present

## 2016-01-05 ENCOUNTER — Other Ambulatory Visit: Payer: Self-pay | Admitting: Obstetrics & Gynecology

## 2016-01-05 ENCOUNTER — Ambulatory Visit
Admission: RE | Admit: 2016-01-05 | Discharge: 2016-01-05 | Disposition: A | Discharge status: Home / Self Care | Payer: Federal, State, Local not specified - PPO | Source: Ambulatory Visit | Attending: Obstetrics & Gynecology | Admitting: Obstetrics & Gynecology

## 2016-01-05 ENCOUNTER — Ambulatory Visit: Payer: Federal, State, Local not specified - PPO

## 2016-01-05 DIAGNOSIS — R928 Other abnormal and inconclusive findings on diagnostic imaging of breast: Secondary | ICD-10-CM

## 2016-01-05 DIAGNOSIS — N644 Mastodynia: Secondary | ICD-10-CM | POA: Diagnosis not present

## 2016-01-05 DIAGNOSIS — N63 Unspecified lump in breast: Secondary | ICD-10-CM | POA: Diagnosis not present

## 2016-01-09 ENCOUNTER — Ambulatory Visit (HOSPITAL_COMMUNITY)
Admission: RE | Admit: 2016-01-09 | Discharge: 2016-01-09 | Disposition: A | Discharge status: Home / Self Care | Payer: Federal, State, Local not specified - PPO | Source: Ambulatory Visit | Attending: Orthopaedic Surgery | Admitting: Orthopaedic Surgery

## 2016-01-09 DIAGNOSIS — M47812 Spondylosis without myelopathy or radiculopathy, cervical region: Secondary | ICD-10-CM | POA: Diagnosis not present

## 2016-01-09 DIAGNOSIS — M4802 Spinal stenosis, cervical region: Secondary | ICD-10-CM | POA: Diagnosis not present

## 2016-01-09 DIAGNOSIS — M542 Cervicalgia: Secondary | ICD-10-CM

## 2016-01-10 DIAGNOSIS — M4712 Other spondylosis with myelopathy, cervical region: Secondary | ICD-10-CM | POA: Diagnosis not present

## 2016-01-10 DIAGNOSIS — M542 Cervicalgia: Secondary | ICD-10-CM | POA: Diagnosis not present

## 2016-01-16 ENCOUNTER — Ambulatory Visit
Admission: RE | Admit: 2016-01-16 | Discharge: 2016-01-16 | Disposition: A | Discharge status: Home / Self Care | Payer: Federal, State, Local not specified - PPO | Source: Ambulatory Visit | Attending: Obstetrics & Gynecology | Admitting: Obstetrics & Gynecology

## 2016-01-16 ENCOUNTER — Other Ambulatory Visit: Payer: Self-pay | Admitting: Obstetrics & Gynecology

## 2016-01-16 DIAGNOSIS — R928 Other abnormal and inconclusive findings on diagnostic imaging of breast: Secondary | ICD-10-CM

## 2016-01-23 DIAGNOSIS — M4712 Other spondylosis with myelopathy, cervical region: Secondary | ICD-10-CM | POA: Diagnosis not present

## 2016-01-23 DIAGNOSIS — N189 Chronic kidney disease, unspecified: Secondary | ICD-10-CM | POA: Diagnosis not present

## 2016-01-23 DIAGNOSIS — Z79899 Other long term (current) drug therapy: Secondary | ICD-10-CM | POA: Diagnosis not present

## 2016-01-23 DIAGNOSIS — M5012 Mid-cervical disc disorder, unspecified level: Secondary | ICD-10-CM | POA: Diagnosis not present

## 2016-01-23 DIAGNOSIS — M5000 Cervical disc disorder with myelopathy, unspecified cervical region: Secondary | ICD-10-CM | POA: Diagnosis not present

## 2016-01-23 DIAGNOSIS — M5002 Cervical disc disorder with myelopathy, mid-cervical region, unspecified level: Secondary | ICD-10-CM | POA: Diagnosis not present

## 2016-01-23 DIAGNOSIS — Z981 Arthrodesis status: Secondary | ICD-10-CM | POA: Diagnosis not present

## 2016-01-23 DIAGNOSIS — M4722 Other spondylosis with radiculopathy, cervical region: Secondary | ICD-10-CM | POA: Diagnosis not present

## 2016-01-23 DIAGNOSIS — M4125 Other idiopathic scoliosis, thoracolumbar region: Secondary | ICD-10-CM | POA: Diagnosis not present

## 2016-01-24 DIAGNOSIS — N189 Chronic kidney disease, unspecified: Secondary | ICD-10-CM | POA: Diagnosis not present

## 2016-01-24 DIAGNOSIS — M5000 Cervical disc disorder with myelopathy, unspecified cervical region: Secondary | ICD-10-CM | POA: Diagnosis not present

## 2016-01-24 DIAGNOSIS — M4125 Other idiopathic scoliosis, thoracolumbar region: Secondary | ICD-10-CM | POA: Diagnosis not present

## 2016-01-24 DIAGNOSIS — Z981 Arthrodesis status: Secondary | ICD-10-CM | POA: Diagnosis not present

## 2016-01-24 DIAGNOSIS — Z79899 Other long term (current) drug therapy: Secondary | ICD-10-CM | POA: Diagnosis not present

## 2016-01-24 DIAGNOSIS — M4722 Other spondylosis with radiculopathy, cervical region: Secondary | ICD-10-CM | POA: Diagnosis not present

## 2016-02-03 ENCOUNTER — Other Ambulatory Visit: Payer: Self-pay | Admitting: Obstetrics & Gynecology

## 2016-02-03 DIAGNOSIS — R928 Other abnormal and inconclusive findings on diagnostic imaging of breast: Secondary | ICD-10-CM

## 2016-02-15 DIAGNOSIS — M542 Cervicalgia: Secondary | ICD-10-CM | POA: Diagnosis not present

## 2016-03-12 DIAGNOSIS — K080 Exfoliation of teeth due to systemic causes: Secondary | ICD-10-CM | POA: Diagnosis not present

## 2016-04-17 DIAGNOSIS — M791 Myalgia: Secondary | ICD-10-CM | POA: Diagnosis not present

## 2016-04-17 DIAGNOSIS — M4322 Fusion of spine, cervical region: Secondary | ICD-10-CM | POA: Diagnosis not present

## 2016-04-17 DIAGNOSIS — M4122 Other idiopathic scoliosis, cervical region: Secondary | ICD-10-CM | POA: Diagnosis not present

## 2016-05-02 ENCOUNTER — Encounter: Payer: Self-pay | Admitting: Vascular Surgery

## 2016-05-06 ENCOUNTER — Ambulatory Visit (INDEPENDENT_AMBULATORY_CARE_PROVIDER_SITE_OTHER): Payer: Federal, State, Local not specified - PPO | Admitting: Vascular Surgery

## 2016-05-06 ENCOUNTER — Encounter: Payer: Self-pay | Admitting: Vascular Surgery

## 2016-05-06 VITALS — BP 135/80 | HR 89 | Temp 98.8°F | Resp 16 | Ht 68.0 in | Wt 182.0 lb

## 2016-05-06 DIAGNOSIS — I83893 Varicose veins of bilateral lower extremities with other complications: Secondary | ICD-10-CM | POA: Insufficient documentation

## 2016-05-06 NOTE — Progress Notes (Signed)
Subjective:     Patient ID: Heather Davis, female   DOB: Jan 01, 1966, 50 y.o.   MRN: [id number]  HPI This 50 year old female is evaluated for prominent veins in the right leg primarily. She has no history of DVT, phlebitis stasis ulcers or bleeding. She has noticed some very large superficial veins in the right pretibial region which frequently will cause discomfort particularly if she bumps her leg on something. She has no distal edema. She does not were elastic compression stockings nor elevate her legs on a regular basis.  Past Medical History:  Diagnosis Date  . Varicose veins     Social History  Substance Use Topics  . Smoking status: Never Smoker  . Smokeless tobacco: Never Used  . Alcohol use Yes     Comment: 2 glasses wine a month    Family History  Problem Relation Age of Onset  . Diabetes Mother   . Heart disease Mother   . Hyperlipidemia Mother     No Known Allergies   Current Outpatient Prescriptions:  .  ALPRAZolam (XANAX) 0.25 MG tablet, Take 0.25 mg by mouth., Disp: , Rfl:  .  citalopram (CELEXA) 20 MG tablet, Take 20 mg by mouth., Disp: , Rfl:  .  estradiol cypionate (DEPO-ESTRADIOL) 5 MG/ML injection, Inject into the muscle., Disp: , Rfl:  .  methocarbamol (ROBAXIN) 750 MG tablet, Take 750 mg by mouth., Disp: , Rfl:  .  MedroxyPROGESTERone Acetate 150 MG/ML SUSY, , Disp: , Rfl:   Vitals:   05/06/16 1451  BP: 135/80  Pulse: 89  Resp: 16  Temp: 98.8 F (37.1 C)  SpO2: 98%  Weight: 182 lb (82.6 kg)  Height: 5\' 8"  (1.727 m)    Body mass index is 27.67 kg/m.         Review of Systems Denies chest pain, dyspnea on exertion, PND, orthopnea, hemoptysis. Has had previous cervical spine fusion and thoracoplasty.    Objective:   Physical Exam BP 135/80 (BP Location: Left Arm, Patient Position: Sitting, Cuff Size: Normal)   Pulse 89   Temp 98.8 F (37.1 C)   Resp 16   Ht 5\' 8"  (1.727 m)   Wt 182 lb (82.6 kg)   SpO2 98%   BMI 27.67 kg/m      Gen.-alert and oriented x3 in no apparent distress HEENT normal for age Lungs no rhonchi or wheezing Cardiovascular regular rhythm no murmurs carotid pulses 3+ palpable no bruits audible Abdomen soft nontender no palpable masses Musculoskeletal free of  major deformities Skin clear -no rashes Neurologic normal Lower extremities 3+ femoral and dorsalis pedis pulses palpable bilaterally with no edema Very prominent veins in the right pretibial region with no large bulging varicosities. No hyperpigmentation or ulceration is noted.  Today I did perform a bedside bilateral SonoSite ultrasound exam and it does appear that the great saphenous veins are large in caliber bilaterally with some reflux.      Assessment:     Prominent veins right pretibial region with possible reflux bilateral great saphenous veins causing pain    Plan:     Plan formal bilateral venous reflux exam and patient to return to see me for formal recommendation

## 2016-05-07 ENCOUNTER — Encounter: Payer: Self-pay | Admitting: Vascular Surgery

## 2016-05-07 ENCOUNTER — Encounter: Payer: Federal, State, Local not specified - PPO | Admitting: Vascular Surgery

## 2016-05-08 DIAGNOSIS — R079 Chest pain, unspecified: Secondary | ICD-10-CM | POA: Diagnosis not present

## 2016-05-08 NOTE — Addendum Note (Signed)
Addended by: Yolonda KidaEVANS, Inna Tisdell N on: 05/08/2016 03:03 PM   Modules accepted: Orders

## 2016-05-13 ENCOUNTER — Ambulatory Visit (INDEPENDENT_AMBULATORY_CARE_PROVIDER_SITE_OTHER): Payer: Federal, State, Local not specified - PPO | Admitting: Vascular Surgery

## 2016-05-13 ENCOUNTER — Ambulatory Visit (HOSPITAL_COMMUNITY)
Admission: RE | Admit: 2016-05-13 | Discharge: 2016-05-13 | Disposition: A | Discharge status: Home / Self Care | Payer: Federal, State, Local not specified - PPO | Source: Ambulatory Visit | Attending: Vascular Surgery | Admitting: Vascular Surgery

## 2016-05-13 ENCOUNTER — Encounter: Payer: Self-pay | Admitting: Vascular Surgery

## 2016-05-13 VITALS — BP 122/82 | HR 78 | Temp 97.6°F | Resp 16 | Ht 68.0 in | Wt 182.0 lb

## 2016-05-13 DIAGNOSIS — I83899 Varicose veins of unspecified lower extremities with other complications: Secondary | ICD-10-CM | POA: Insufficient documentation

## 2016-05-13 DIAGNOSIS — I83891 Varicose veins of right lower extremities with other complications: Secondary | ICD-10-CM

## 2016-05-13 DIAGNOSIS — I83893 Varicose veins of bilateral lower extremities with other complications: Secondary | ICD-10-CM | POA: Diagnosis not present

## 2016-05-13 NOTE — Progress Notes (Signed)
Subjective:     Patient ID: Heather Davis, female   DOB: September 15, 1965, 50 y.o.   MRN: [id number]  HPI this 50 year old healthy female returns to discuss the findings of her bilateral venous reflux exam. She has prominent veins in the right pretibial region which ache when they are gently traumatized. She has no history of DVT thrombophlebitis stasis ulcers bleeding or edema. She does not elastic compression stockings.  Past Medical History:  Diagnosis Date  . Varicose veins     Social History  Substance Use Topics  . Smoking status: Never Smoker  . Smokeless tobacco: Never Used  . Alcohol use Yes     Comment: 2 glasses wine a month    Family History  Problem Relation Age of Onset  . Diabetes Mother   . Heart disease Mother   . Hyperlipidemia Mother     No Known Allergies   Current Outpatient Prescriptions:  .  ALPRAZolam (XANAX) 0.25 MG tablet, Take 0.25 mg by mouth., Disp: , Rfl:  .  citalopram (CELEXA) 20 MG tablet, Take 20 mg by mouth., Disp: , Rfl:  .  estradiol cypionate (DEPO-ESTRADIOL) 5 MG/ML injection, Inject into the muscle., Disp: , Rfl:  .  MedroxyPROGESTERone Acetate 150 MG/ML SUSY, , Disp: , Rfl:  .  methocarbamol (ROBAXIN) 750 MG tablet, Take 750 mg by mouth., Disp: , Rfl:   Vitals:   05/13/16 1047  BP: 122/82  Pulse: 78  Resp: 16  Temp: 97.6 F (36.4 C)  SpO2: 99%  Weight: 182 lb (82.6 kg)  Height: 5\' 8"  (1.727 m)    Body mass index is 27.67 kg/m.        Review of Systems Denies chest pain, dyspnea on exertion, PND, orthopnea, hemoptysis    Objective:   Physical Exam BP 122/82   Pulse 78   Temp 97.6 F (36.4 C)   Resp 16   Ht 5\' 8"  (1.727 m)   Wt 182 lb (82.6 kg)   SpO2 99%   BMI 27.67 kg/m   Gen. well-developed well-nourished female no apparent distress alert and oriented 3 Lungs no rhonchi or wheezing Right leg with prominent vein in the proximal third and mid third over the tibia which is not varicosed. No evidence of  superficial thrombophlebitis. 3+ dorsalis pedis pulse palpable.  I reviewed the results of the venous reflux exam performed which reveals no evidence of significant deep or superficial venous reflux bilaterally with small caliber vein. There is some focal reflux in the mid thighs bilaterally which is not significant     Assessment:     Superficial vein right pretibial region causing discomfort when mildly traumatized    Plan:     Have offered patient foam sclerotherapy if she would decide she would like this perform she will be in touch with

## 2016-06-18 DIAGNOSIS — O24419 Gestational diabetes mellitus in pregnancy, unspecified control: Secondary | ICD-10-CM | POA: Diagnosis not present

## 2016-06-18 DIAGNOSIS — I8390 Asymptomatic varicose veins of unspecified lower extremity: Secondary | ICD-10-CM | POA: Diagnosis not present

## 2016-06-18 DIAGNOSIS — M4186 Other forms of scoliosis, lumbar region: Secondary | ICD-10-CM | POA: Diagnosis not present

## 2016-06-18 DIAGNOSIS — R928 Other abnormal and inconclusive findings on diagnostic imaging of breast: Secondary | ICD-10-CM | POA: Diagnosis not present

## 2016-06-19 ENCOUNTER — Other Ambulatory Visit: Payer: Self-pay | Admitting: Obstetrics & Gynecology

## 2016-06-19 DIAGNOSIS — N63 Unspecified lump in unspecified breast: Secondary | ICD-10-CM

## 2016-06-27 ENCOUNTER — Ambulatory Visit
Admission: RE | Admit: 2016-06-27 | Discharge: 2016-06-27 | Disposition: A | Discharge status: Home / Self Care | Payer: Federal, State, Local not specified - PPO | Source: Ambulatory Visit | Attending: Obstetrics & Gynecology | Admitting: Obstetrics & Gynecology

## 2016-06-27 DIAGNOSIS — R922 Inconclusive mammogram: Secondary | ICD-10-CM | POA: Diagnosis not present

## 2016-06-27 DIAGNOSIS — N63 Unspecified lump in unspecified breast: Secondary | ICD-10-CM

## 2016-07-03 DIAGNOSIS — L710 Perioral dermatitis: Secondary | ICD-10-CM | POA: Diagnosis not present

## 2016-07-03 DIAGNOSIS — L821 Other seborrheic keratosis: Secondary | ICD-10-CM | POA: Diagnosis not present

## 2016-07-09 ENCOUNTER — Encounter: Payer: Federal, State, Local not specified - PPO | Admitting: Vascular Surgery

## 2016-08-01 DIAGNOSIS — M542 Cervicalgia: Secondary | ICD-10-CM | POA: Diagnosis not present

## 2016-08-01 DIAGNOSIS — M4324 Fusion of spine, thoracic region: Secondary | ICD-10-CM | POA: Diagnosis not present

## 2016-08-01 DIAGNOSIS — M4326 Fusion of spine, lumbar region: Secondary | ICD-10-CM | POA: Diagnosis not present

## 2016-08-01 DIAGNOSIS — M4322 Fusion of spine, cervical region: Secondary | ICD-10-CM | POA: Diagnosis not present

## 2016-10-01 DIAGNOSIS — K080 Exfoliation of teeth due to systemic causes: Secondary | ICD-10-CM | POA: Diagnosis not present

## 2016-10-03 DIAGNOSIS — R748 Abnormal levels of other serum enzymes: Secondary | ICD-10-CM | POA: Diagnosis not present

## 2016-10-03 DIAGNOSIS — R110 Nausea: Secondary | ICD-10-CM | POA: Diagnosis not present

## 2016-10-03 DIAGNOSIS — R112 Nausea with vomiting, unspecified: Secondary | ICD-10-CM | POA: Diagnosis not present

## 2016-10-03 DIAGNOSIS — R0683 Snoring: Secondary | ICD-10-CM | POA: Diagnosis not present

## 2016-10-07 DIAGNOSIS — R197 Diarrhea, unspecified: Secondary | ICD-10-CM | POA: Diagnosis not present

## 2016-10-17 DIAGNOSIS — R748 Abnormal levels of other serum enzymes: Secondary | ICD-10-CM | POA: Diagnosis not present

## 2016-10-17 DIAGNOSIS — F325 Major depressive disorder, single episode, in full remission: Secondary | ICD-10-CM | POA: Diagnosis not present

## 2016-10-17 DIAGNOSIS — R112 Nausea with vomiting, unspecified: Secondary | ICD-10-CM | POA: Diagnosis not present

## 2016-10-17 DIAGNOSIS — R197 Diarrhea, unspecified: Secondary | ICD-10-CM | POA: Diagnosis not present

## 2016-10-22 ENCOUNTER — Institutional Professional Consult (permissible substitution): Payer: Federal, State, Local not specified - PPO | Admitting: Neurology

## 2016-10-31 DIAGNOSIS — M4325 Fusion of spine, thoracolumbar region: Secondary | ICD-10-CM | POA: Diagnosis not present

## 2016-10-31 DIAGNOSIS — M4324 Fusion of spine, thoracic region: Secondary | ICD-10-CM | POA: Diagnosis not present

## 2016-10-31 DIAGNOSIS — M4322 Fusion of spine, cervical region: Secondary | ICD-10-CM | POA: Diagnosis not present

## 2016-11-06 ENCOUNTER — Encounter: Payer: Self-pay | Admitting: Neurology

## 2016-11-06 ENCOUNTER — Ambulatory Visit (INDEPENDENT_AMBULATORY_CARE_PROVIDER_SITE_OTHER): Payer: Federal, State, Local not specified - PPO | Admitting: Neurology

## 2016-11-06 VITALS — BP 118/76 | HR 78 | Resp 16 | Ht 68.0 in | Wt 184.0 lb

## 2016-11-06 DIAGNOSIS — G2581 Restless legs syndrome: Secondary | ICD-10-CM

## 2016-11-06 DIAGNOSIS — F39 Unspecified mood [affective] disorder: Secondary | ICD-10-CM

## 2016-11-06 DIAGNOSIS — R0681 Apnea, not elsewhere classified: Secondary | ICD-10-CM | POA: Diagnosis not present

## 2016-11-06 DIAGNOSIS — R0683 Snoring: Secondary | ICD-10-CM | POA: Diagnosis not present

## 2016-11-06 DIAGNOSIS — R400 Somnolence: Secondary | ICD-10-CM | POA: Diagnosis not present

## 2016-11-06 DIAGNOSIS — Z820 Family history of epilepsy and other diseases of the nervous system: Secondary | ICD-10-CM
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
## 2016-11-06 NOTE — Patient Instructions (Addendum)

## 2016-12-06 ENCOUNTER — Encounter: Payer: Self-pay | Admitting: Neurology

## 2016-12-09 ENCOUNTER — Ambulatory Visit (INDEPENDENT_AMBULATORY_CARE_PROVIDER_SITE_OTHER): Payer: Federal, State, Local not specified - PPO | Admitting: Neurology

## 2016-12-09 DIAGNOSIS — G4761 Periodic limb movement disorder: Secondary | ICD-10-CM

## 2016-12-09 DIAGNOSIS — G4733 Obstructive sleep apnea (adult) (pediatric): Secondary | ICD-10-CM | POA: Diagnosis not present

## 2016-12-12 ENCOUNTER — Telehealth: Payer: Self-pay

## 2016-12-12 NOTE — Telephone Encounter (Signed)
-----   Message from [name], MD sent at 12/12/2016  7:55 AM EDT ----- Patient referred by Dr. Link Snuffer, seen by me on 11/06/16, diagnostic PSG on 12/09/16.    Please call and notify the patient that the recent sleep study did confirm the diagnosis of moderate to severe obstructive sleep apnea (by number of events, lowest desaturation to 83%) and that I recommend treatment for this in the form of CPAP. This will require a repeat sleep study for proper titration and mask fitting. Please explain to patient and arrange for a CPAP titration study. I have placed an order in the chart. Thanks, and please route to [facility] for scheduling next sleep study.  [name], MD, PhD [facility])

## 2016-12-12 NOTE — Progress Notes (Signed)
Patient referred by Dr. Link Snuffer, seen by me on 11/06/16, diagnostic PSG on 12/09/16.    Please call and notify the patient that the recent sleep study did confirm the diagnosis of moderate to severe obstructive sleep apnea (by number of events, lowest desaturation to 83%) and that I recommend treatment for this in the form of CPAP. This will require a repeat sleep study for proper titration and mask fitting. Please explain to patient and arrange for a CPAP titration study. I have placed an order in the chart. Thanks, and please route to [facility] for scheduling next sleep study.  [name], MD, PhD [facility])

## 2016-12-12 NOTE — Procedures (Signed)
PATIENT'S NAME:  Heather Davis, Heather Davis DOB:      1965-11-19      MR#:    [id number]     DATE OF RECORDING: 12/09/2016 REFERRING M.D.:  Alysia Penna, MD Study Performed:   Baseline Polysomnogram HISTORY: 51 year old woman with a history of scoliosis, varicose veins, gestational diabetes, depression, anxiety, status post spine surgery in February 2017, then status post neck surgery in June 2017, prediabetes and overweight state, who reports snoring, excessive daytime somnolence and witnessed breathing pauses while asleep. The patient endorsed the Epworth Sleepiness Scale at 17/24 points. The patient's weight 184 pounds with a height of 68 (inches), resulting in a BMI of 27.7 kg/m2. The patient's neck circumference measured 15 inches.  CURRENT MEDICATIONS: Xanax, Celexa, Depo-Estradiol, Medroxyprogresterone, Robaxin.   PROCEDURE:  This is a multichannel digital polysomnogram utilizing the Somnostar 11.2 system.  Electrodes and sensors were applied and monitored per AASM Specifications.   EEG, EOG, Chin and Limb EMG, were sampled at 200 Hz.  ECG, Snore and Nasal Pressure, Thermal Airflow, Respiratory Effort, CPAP Flow and Pressure, Oximetry was sampled at 50 Hz. Digital video and audio were recorded.      BASELINE STUDY  Lights Out was at 22:19 and Lights On at 05:00.  Total recording time (TRT) was 402 minutes, with a total sleep time (TST) of  366 minutes.   The patient's sleep latency was 19.5 minutes.  REM latency was 75.5 minutes, which is normal.  The sleep efficiency was 91. %.     SLEEP ARCHITECTURE: WASO (Wake after sleep onset) was 21 minutes.  There were 18.5 minutes in Stage N1, 206.5 minutes Stage N2, 60.5 minutes Stage N3 and 80.5 minutes in Stage REM.  The percentage of Stage N1 was 5.1%, Stage N2 was 56.4%, which is near normal, Stage N3 was 16.5%, which is normal and Stage R (REM sleep) was 22.%, which is normal.   The arousals were noted as: 35 were spontaneous, 18 were associated with PLMs,  13 were associated with respiratory events.    Audio and video analysis did not show any abnormal or unusual movements, behaviors, phonations or vocalizations.  The patient took no bathroom breaks. Mild to moderate snoring was noted.  EKG was in keeping with normal sinus rhythm (NSR).  RESPIRATORY ANALYSIS:  There were a total of 93 respiratory events:  23 obstructive apneas, 4 central apneas and 0 mixed apneas with a total of 27 apneas and an apnea index (AI) of 4.4 /hour. There were 66 hypopneas with a hypopnea index of 10.8 /hour. The patient also had 0 respiratory event related arousals (RERAs).      The total APNEA/HYPOPNEA INDEX (AHI) was 15.2/hour and the total RESPIRATORY DISTURBANCE INDEX was 15.2 /hour.  23 events occurred in REM sleep and 94 events in NREM. The REM AHI was 17.1 /hour, versus a non-REM AHI of 14.7. The patient spent 50.5 minutes of total sleep time in the supine position and 316 minutes in non-supine.. The supine AHI was 34.4 versus a non-supine AHI of 12.2.  OXYGEN SATURATION & C02:  The Wake baseline 02 saturation was 96%, with the lowest being 83%. Time spent below 89% saturation equaled 14 minutes.  PERIODIC LIMB MOVEMENTS: The patient had a total of 130 Periodic Limb Movements.  The Periodic Limb Movement (PLM) index was 21.3 and the PLM Arousal index was 3./hour.  Post-study, the patient indicated that sleep was the same as usual.   IMPRESSION:  1. Obstructive Sleep Apnea (OSA) 2.  Periodic Limb Movement Disorder (PLMD)  RECOMMENDATIONS:  1. This overnight polysomnogram demonstrates overall moderate obstructive sleep apnea (by number of events), severe by numbers during supine sleep. Please note, the absence of supine REM sleep during this study may underestimate her AHI and O2 nadir. Given the patient's sleep related complaints, treatment with positive airway pressure (in the form of CPAP) is recommended and achieved during a full night CPAP titration study for  proper treatment settings and mask fitting. Generally speaking, other treatment options may include: avoidance of the supine sleep position, weight loss, an oral appliance (aka dental device, custom made by a specialized dentist usually), or upper airway or jaw surgery (not usually first line treatments).  2. The patient should be cautioned not to drive, work at heights, or operate dangerous or heavy equipment when tired or sleepy. Review and reiteration of good sleep hygiene measures should be pursued with any patient. 3. Mild PLMs (periodic limb movements of sleep) were noted during the study without significant arousals; clinical correlation is recommended. Medication effect from the patient's antidepressant medication should be considered.  4. The patient will be seen in follow-up by Dr. Frances Furbish at [facility] for discussion of the test results and further management strategies. The referring provider will be notified of the test results.  I certify that I have reviewed the entire raw data recording prior to the issuance of this report in accordance with the Standards of Accreditation of the American Academy of Sleep Medicine (AASM)       [name], MD, PhD Diplomat, American Board of Psychiatry and Neurology (Neurology and Sleep Medicine)

## 2016-12-12 NOTE — Telephone Encounter (Signed)
I called Heather Davis. I advised Heather Davis that Dr. Frances Furbish reviewed their sleep study results and found that Heather Davis has moderate to severe osa. Dr. Frances Furbish recommends that Heather Davis return for a repeat sleep study in order to properly titrate the cpap and ensure a good mask fit. Heather Davis is agreeable to returning for a titration study. I advised Heather Davis that our sleep lab will file with Heather Davis's insurance and call Heather Davis to schedule the sleep study when we hear back from the Heather Davis's insurance regarding coverage of this sleep study. Heather Davis verbalized understanding of results. Heather Davis had no questions at this time but was encouraged to call back if questions arise.

## 2016-12-12 NOTE — Addendum Note (Signed)
Addended by: [name] on: 12/12/2016 07:55 AM   Modules accepted: Orders

## 2016-12-23 ENCOUNTER — Ambulatory Visit (INDEPENDENT_AMBULATORY_CARE_PROVIDER_SITE_OTHER): Payer: Federal, State, Local not specified - PPO | Admitting: Neurology

## 2016-12-23 DIAGNOSIS — G4733 Obstructive sleep apnea (adult) (pediatric): Secondary | ICD-10-CM | POA: Diagnosis not present

## 2016-12-23 DIAGNOSIS — G4720 Circadian rhythm sleep disorder, unspecified type: Secondary | ICD-10-CM

## 2016-12-23 DIAGNOSIS — G4761 Periodic limb movement disorder: Secondary | ICD-10-CM

## 2016-12-24 ENCOUNTER — Other Ambulatory Visit: Payer: Self-pay | Admitting: Obstetrics & Gynecology

## 2016-12-24 DIAGNOSIS — N6489 Other specified disorders of breast: Secondary | ICD-10-CM

## 2016-12-24 DIAGNOSIS — R928 Other abnormal and inconclusive findings on diagnostic imaging of breast: Secondary | ICD-10-CM

## 2016-12-24 DIAGNOSIS — K080 Exfoliation of teeth due to systemic causes: Secondary | ICD-10-CM | POA: Diagnosis not present

## 2016-12-25 ENCOUNTER — Telehealth: Payer: Self-pay

## 2016-12-25 NOTE — Procedures (Signed)
PATIENT'S NAME:  Heather Davis, Heather Davis DOB:      [birth year]      MR#:    [id number]     DATE OF RECORDING: 12/23/2016 REFERRING M.D.:  Alysia PennaScott Holwerda, MD Study Performed:   CPAP  Titration HISTORY: 51 year old woman with a history of scoliosis, varicose veins, gestational diabetes, depression, anxiety, status post spine surgery in February 2017, then status post neck surgery in June 2017, prediabetes and overweight state, who presents for a full night CPAP titration to treat her OSA. The patient endorsed the Epworth Sleepiness Scale at 17/24 points. The patient's weight 184 pounds with a height of 68 (inches), resulting in a BMI of 27.7 kg/m2. The patient's neck circumference measured 15 inches. She had a baseline sleep study on 12/09/16, which showed an AHI of 15.2/hour, REM AHI of 17.1/hour, supine AHI of 34.4/hour and O2 nadir of 83%. Time spent below 89% saturation equaled 14 minutes.  CURRENT MEDICATIONS: Xanax, Celexa, Depo-Estradiol, Medroxyprogresterone, Robaxin.  PROCEDURE:  This is a multichannel digital polysomnogram utilizing the SomnoStar 11.2 system.  Electrodes and sensors were applied and monitored per AASM Specifications.   EEG, EOG, Chin and Limb EMG, were sampled at 200 Hz.  ECG, Snore and Nasal Pressure, Thermal Airflow, Respiratory Effort, CPAP Flow and Pressure, Oximetry was sampled at 50 Hz. Digital video and audio were recorded.      The patient was fitted with a small Simplus FFM (patient preference), CPAP was initiated at 5 cmH20 with heated humidity per AASM standards and pressure was advanced to 11 cmH20 because of hypopneas, apneas and desaturations.  At a PAP pressure of 11 cmH20, there was a reduction of the AHI to 0/hour, with supine REM sleep achieved and O2 nadir of 93%.   Lights Out was at 20:44 and Lights On at 04:59. Total recording time (TRT) was 495.5 minutes, with a total sleep time (TST) of 458 minutes. The patient's sleep latency was 13.5 minutes. REM latency was 76.5  minutes, which is normal.  The sleep efficiency was 92.4 %.    SLEEP ARCHITECTURE: WASO (Wake after sleep onset)  was 23.5 minutes with mild sleep fragmentation noted.  There were 22.5 minutes in Stage N1, 199.5 minutes Stage N2, 118.5 minutes Stage N3 and 117.5 minutes in Stage REM.  The percentage of Stage N1 was 4.9%, Stage N2 was 43.6%, Stage N3 was 25.9%, which is mildly increased, and Stage R (REM sleep) was 25.7%, which is high normal.  The arousals were noted as: 36 were spontaneous, 24 were associated with PLMs, 9 were associated with respiratory events.  Audio and video analysis did not show any abnormal or unusual movements, behaviors, phonations or vocalizations. The patient took no bathroom breaks. The EKG was in keeping with normal sinus rhythm (NSR).  RESPIRATORY ANALYSIS:  There was a total of 40 respiratory events: 9 obstructive apneas, 18 central apneas and 0 mixed apneas with a total of 27 apneas and an apnea index (AI) of 3.5 /hour. There were 13 hypopneas with a hypopnea index of 1.7/hour. The patient also had 0 respiratory event related arousals (RERAs).      The total APNEA/HYPOPNEA INDEX  (AHI) was 5.2 /hour and the total RESPIRATORY DISTURBANCE INDEX was 5.2 .hour  5 events occurred in REM sleep and 35 events in NREM. The REM AHI was 2.6 /hour versus a non-REM AHI of 6.2 /hour.  The patient spent 220 minutes of total sleep time in the supine position and 238 minutes in non-supine. The supine  AHI was 3.8, versus a non-supine AHI of 6.6.  OXYGEN SATURATION & C02:  The baseline 02 saturation was 94%, with the lowest being 90%. Time spent below 89% saturation equaled 0 minutes.  PERIODIC LIMB MOVEMENTS: The patient had a total of 232 Periodic Limb Movements. The Periodic Limb Movement (PLM) index was 30.4 and the PLM Arousal index was 3.1 /hour. Post-study, the patient indicated that sleep was the same as usual.   DIAGNOSIS 1. Obstructive Sleep Apnea (OSA)  2. Periodic Limb  Movement Disorder (PLMD)  3. Dysfunctions associated with sleep stages or arousal from sleep    PLANS/RECOMMENDATIONS: 1. This study demonstrates resolution of the patient's obstructive sleep apnea with CPAP therapy. I will, therefore, start the patient on home CPAP treatment at a pressure of 11 cm via small full face mask with heated humidity. The patient should be reminded to be fully compliant with PAP therapy to improve sleep related symptoms and decrease long term cardiovascular risks. The patient should be reminded, that it may take up to 3 months to get fully used to using PAP with all planned sleep. The earlier full compliance is achieved, the better long term compliance tends to be. Please note that untreated obstructive sleep apnea carries additional perioperative morbidity. Patients with significant obstructive sleep apnea should receive perioperative PAP therapy and the surgeons and particularly the anesthesiologist should be informed of the diagnosis and the severity of the sleep disordered breathing. 2. Moderate PLMs (periodic limb movements of sleep) were noted during this study with minimal arousals; clinical correlation is recommended. Medication effect from the patient's antidepressant medication should be considered.  3. This study shows sleep fragmentation and abnormal sleep stage percentages; these are nonspecific findings and per se do not signify an intrinsic sleep disorder or a cause for the patient's sleep-related symptoms. Causes include (but are not limited to) the first night effect of the sleep study, circadian rhythm disturbances, medication effect or an underlying mood disorder or medical problem.  4. The patient should be cautioned not to drive, work at heights, or operate dangerous or heavy equipment when tired or sleepy. Review and reiteration of good sleep hygiene measures should be pursued with any patient. 5. The patient will be seen in follow-up by Dr. Frances Furbish at [facility] for  discussion of the test results and further management strategies. The referring provider will be notified of the test results.  I certify that I have reviewed the entire raw data recording prior to the issuance of this report in accordance with the Standards of Accreditation of the American Academy of Sleep Medicine (AASM)  [name], MD, PhD Diplomat, American Board of Psychiatry and Neurology (Neurology and Sleep Medicine)

## 2016-12-25 NOTE — Addendum Note (Signed)
Addended by: Huston FoleyATHAR, Ofilia Rayon on: 12/25/2016 08:14 AM   Modules accepted: Orders

## 2016-12-25 NOTE — Telephone Encounter (Signed)
-----   Message from Huston FoleySaima Athar, MD sent at 12/25/2016  8:13 AM EDT ----- Patient referred by Dr. Link SnufferHolwerda, seen by me on 11/06/16, diagnostic PSG on 12/09/16, CPAP titration on 12/23/16.    Please call and inform patient that I have entered an order for treatment with positive airway pressure (PAP) treatment of obstructive sleep apnea (OSA). She did well during the latest sleep study with CPAP. We will, therefore, arrange for a machine for home use through a DME (durable medical equipment) company of Her choice; and I will see the patient back in follow-up in about 10 weeks. Please also explain to the patient that I will be looking out for compliance data, which can be downloaded from the machine (stored on an SD card, that is inserted in the machine) or via remote access through a modem, that is built into the machine. At the time of the followup appointment we will discuss sleep study results and how it is going with PAP treatment at home. Please advise patient to bring Her machine at the time of the first FU visit, even though this is cumbersome. Bringing the machine for every visit after that will likely not be needed, but often helps for the first visit to troubleshoot if needed. Please re-enforce the importance of compliance with treatment and the need for us to monitor compliance data - often an insurance requirement and actually good feedback for the patient as far as how they are doing.  Also remind patient, that any interim PAP machine or mask issues should be first addressed with the DME company, as they can often help better with technical and mask fit issues. Please ask if patient has a preference regarding DME company.  Please also make sure, the patient has a follow-up appointment with me in about 10 weeks from the setup date, thanks.  Once you have spoken to the patient - and faxed/routed report to PCP and referring MD (if other than PCP), you can close this encounter, thanks,   Huston FoleySaima Athar, MD,  PhD [facility]

## 2016-12-25 NOTE — Telephone Encounter (Signed)
FYI I spoke to patient and she is aware of results and recommendations. She states that she would like to pursue a dental device since she is claustrophobic. She has an appt with Dr. Myrtis SerKatz tomorrow.  Patient asked that I sent the study to her personal fax. Confirmed fax number and sent it to patient so she will have for appt tomorrow.  Report sent to PCP.

## 2016-12-25 NOTE — Progress Notes (Signed)
Patient referred by Dr. Link SnufferHolwerda, seen by me on 11/06/16, diagnostic PSG on 12/09/16, CPAP titration on 12/23/16.    Please call and inform patient that I have entered an order for treatment with positive airway pressure (PAP) treatment of obstructive sleep apnea (OSA). She did well during the latest sleep study with CPAP. We will, therefore, arrange for a machine for home use through a DME (durable medical equipment) company of Her choice; and I will see the patient back in follow-up in about 10 weeks. Please also explain to the patient that I will be looking out for compliance data, which can be downloaded from the machine (stored on an SD card, that is inserted in the machine) or via remote access through a modem, that is built into the machine. At the time of the followup appointment we will discuss sleep study results and how it is going with PAP treatment at home. Please advise patient to bring Her machine at the time of the first FU visit, even though this is cumbersome. Bringing the machine for every visit after that will likely not be needed, but often helps for the first visit to troubleshoot if needed. Please re-enforce the importance of compliance with treatment and the need for us to monitor compliance data - often an insurance requirement and actually good feedback for the patient as far as how they are doing.  Also remind patient, that any interim PAP machine or mask issues should be first addressed with the DME company, as they can often help better with technical and mask fit issues. Please ask if patient has a preference regarding DME company.  Please also make sure, the patient has a follow-up appointment with me in about 10 weeks from the setup date, thanks.  Once you have spoken to the patient - and faxed/routed report to PCP and referring MD (if other than PCP), you can close this encounter, thanks,   Huston FoleySaima [place] Giammona, MD, PhD [facility] ([facility]

## 2017-02-04 ENCOUNTER — Ambulatory Visit
Admission: RE | Admit: 2017-02-04 | Discharge: 2017-02-04 | Disposition: A | Discharge status: Home / Self Care | Payer: Federal, State, Local not specified - PPO | Source: Ambulatory Visit | Attending: Obstetrics & Gynecology | Admitting: Obstetrics & Gynecology

## 2017-02-04 DIAGNOSIS — R928 Other abnormal and inconclusive findings on diagnostic imaging of breast: Secondary | ICD-10-CM

## 2017-02-04 DIAGNOSIS — N6489 Other specified disorders of breast: Secondary | ICD-10-CM

## 2017-02-11 DIAGNOSIS — Z6826 Body mass index (BMI) 26.0-26.9, adult: Secondary | ICD-10-CM | POA: Diagnosis not present

## 2017-02-11 DIAGNOSIS — R6882 Decreased libido: Secondary | ICD-10-CM | POA: Diagnosis not present

## 2017-02-11 DIAGNOSIS — Z0000 Encounter for general adult medical examination without abnormal findings: Secondary | ICD-10-CM | POA: Diagnosis not present

## 2017-02-11 DIAGNOSIS — Z124 Encounter for screening for malignant neoplasm of cervix: Secondary | ICD-10-CM | POA: Diagnosis not present

## 2017-02-11 DIAGNOSIS — Z01419 Encounter for gynecological examination (general) (routine) without abnormal findings: Secondary | ICD-10-CM | POA: Diagnosis not present

## 2017-02-18 DIAGNOSIS — Z6828 Body mass index (BMI) 28.0-28.9, adult: Secondary | ICD-10-CM | POA: Diagnosis not present

## 2017-02-18 DIAGNOSIS — F325 Major depressive disorder, single episode, in full remission: Secondary | ICD-10-CM | POA: Diagnosis not present

## 2017-02-18 DIAGNOSIS — W57XXXA Bitten or stung by nonvenomous insect and other nonvenomous arthropods, initial encounter: Secondary | ICD-10-CM | POA: Diagnosis not present

## 2017-02-26 NOTE — Telephone Encounter (Signed)
Pt called back and is asking for a returned call

## 2017-02-26 NOTE — Telephone Encounter (Signed)
Patient called office in reference to wanting to proceed with starting CPAP machine.  Patient is requesting to rent a CPAP machine vs. Buying.  Please call

## 2017-02-26 NOTE — Telephone Encounter (Signed)
I called pt to discuss. No answer, left a message asking her to call me back. 

## 2017-02-26 NOTE — Telephone Encounter (Signed)
I called pt. She reports that her insurance will not allow her to try a dental device to treat her sleep apnea because she has not tried and failed cpap. The cpap titration was not an adequate amount of time used with the cpap to say that the pt failed it. She is asking if she can be placed on a cpap for a while and then if she doesn't tolerate it, we can document this so she can get a dental device. Pt does not want to purchase her cpap and only wants to rent it. I advised her that I can reach out to [facility] and ask if this is possible for her. I reviewed PAP compliance with the pt. Pt is agreeable to using [facility] as her DME for her cpap. An appt was made for 05/06/2017 with the pt to discuss the cpap trial with Dr. Frances FurbishAthar. I will mail the pt a letter with all of this information in it. I confirmed with pt that the address we have on file is correct. Pt verbalized understanding and agreement of this plan.

## 2017-03-03 NOTE — Telephone Encounter (Signed)
Pt said she would rather use [facility] instead of [facility], it is closer to her. Please fax order to [phone] attn: Respiratory/Erica

## 2017-03-03 NOTE — Telephone Encounter (Signed)
Referral sent to [facility]. Received a receipt of confirmation.

## 2017-03-07 DIAGNOSIS — G4733 Obstructive sleep apnea (adult) (pediatric): Secondary | ICD-10-CM | POA: Diagnosis not present

## 2017-04-07 DIAGNOSIS — G4733 Obstructive sleep apnea (adult) (pediatric): Secondary | ICD-10-CM | POA: Diagnosis not present

## 2017-04-17 ENCOUNTER — Ambulatory Visit (HOSPITAL_COMMUNITY)
Admission: EM | Admit: 2017-04-17 | Discharge: 2017-04-17 | Disposition: A | Discharge status: Home / Self Care | Attending: Family Medicine | Admitting: Family Medicine

## 2017-04-17 ENCOUNTER — Encounter (HOSPITAL_COMMUNITY): Payer: Self-pay | Admitting: Family Medicine

## 2017-04-17 DIAGNOSIS — T148XXA Other injury of unspecified body region, initial encounter: Secondary | ICD-10-CM

## 2017-04-17 NOTE — ED Triage Notes (Addendum)
Pt presents with c/o upper back pain. The pain began after she was lifting heavy objects today. The pain is a pulling pain that radiates into her neck. She had back surgery for scoliosis last year. She took a tramadol prior to arrival

## 2017-04-18 ENCOUNTER — Ambulatory Visit: Payer: Self-pay

## 2017-04-18 ENCOUNTER — Other Ambulatory Visit: Payer: Self-pay | Admitting: Occupational Medicine

## 2017-04-18 DIAGNOSIS — M546 Pain in thoracic spine: Secondary | ICD-10-CM

## 2017-04-18 NOTE — ED Provider Notes (Signed)
  [facility]   [id number] 04/17/17 Arrival Time: 1931  ASSESSMENT & PLAN:  1. Muscle strain     Meds ordered this encounter  Medications  . traMADol (ULTRAM) 50 MG tablet    Sig: Take by mouth every 6 (six) hours as needed.   To f/u with [facility] to direct care. May continue tramadol as needed. Recommend ibuprofen. Work note given. Lifting restrictions.  Reviewed expectations re: course of current medical issues. Questions answered. Outlined signs and symptoms indicating need for more acute intervention. Patient verbalized understanding. After Visit Summary given.   SUBJECTIVE:  Heather Davis is a 51 y.o. female who presents with complaint of upper R back strain. Lifting heavy boxes at work today when she began to feel discomfort. Steady since then. Describes as "pulling." No extremity sensation changes or weakness. History of scoliosis surgery last year. Had tramadol at home. Took one before coming here. Ambulatory. No headaches. No direct trauma to back or neck.  ROS: As per HPI. All other systems negative.  OBJECTIVE:  Vitals:   04/17/17 2020  BP: (!) 157/90  Pulse: 80  Resp: 18  Temp: 98.6 F (37 C)  TempSrc: Oral  SpO2: 99%    General appearance: alert; no distress Eyes: PERRLA; EOMI; conjunctiva normal HENT: normocephalic; atraumatic Neck: supple; tenderness over L posterior neck musculature extending over L trapezius distribution; no midline tenderness Extremities: no cyanosis or edema; symmetrical with no gross deformities Skin: warm and dry Psychological: alert and cooperative; normal mood and affect  No Known Allergies  Past Medical History:  Diagnosis Date  . Major depression in remission (HCC)   . Scoliosis   . Varicose veins    Social History   Social History  . Marital status: Divorced    Spouse name: N/A  . Number of children: 2  . Years of education: college   Occupational History  . Not on file.   Social  History Main Topics  . Smoking status: Never Smoker  . Smokeless tobacco: Never Used  . Alcohol use Yes     Comment: 2 glasses wine a month  . Drug use: No  . Sexual activity: Not on file   Other Topics Concern  . Not on file   Social History Narrative   Drinks about 2 caffeine drinks a day    Family History  Problem Relation Age of Onset  . Diabetes Mother   . Heart disease Mother   . Hyperlipidemia Mother    Past Surgical History:  Procedure Laterality Date  . APPENDECTOMY    . BACK SURGERY  09/2015  . CERVICAL FUSION    . CESAREAN SECTION    . CHOLECYSTECTOMY    . PANNICULECTOMY       Mardella LaymanHagler, Luther Newhouse, MD 04/18/17 [phone]

## 2017-04-23 DIAGNOSIS — M4325 Fusion of spine, thoracolumbar region: Secondary | ICD-10-CM | POA: Diagnosis not present

## 2017-04-23 DIAGNOSIS — M4322 Fusion of spine, cervical region: Secondary | ICD-10-CM | POA: Diagnosis not present

## 2017-04-23 DIAGNOSIS — M791 Myalgia: Secondary | ICD-10-CM | POA: Diagnosis not present

## 2017-04-30 DIAGNOSIS — M546 Pain in thoracic spine: Secondary | ICD-10-CM | POA: Diagnosis not present

## 2017-04-30 DIAGNOSIS — M549 Dorsalgia, unspecified: Secondary | ICD-10-CM | POA: Diagnosis not present

## 2017-04-30 DIAGNOSIS — M545 Low back pain: Secondary | ICD-10-CM | POA: Diagnosis not present

## 2017-04-30 DIAGNOSIS — M4325 Fusion of spine, thoracolumbar region: Secondary | ICD-10-CM | POA: Diagnosis not present

## 2017-05-01 DIAGNOSIS — M549 Dorsalgia, unspecified: Secondary | ICD-10-CM | POA: Diagnosis not present

## 2017-05-01 DIAGNOSIS — M545 Low back pain: Secondary | ICD-10-CM | POA: Diagnosis not present

## 2017-05-01 DIAGNOSIS — M546 Pain in thoracic spine: Secondary | ICD-10-CM | POA: Diagnosis not present

## 2017-05-01 DIAGNOSIS — M4325 Fusion of spine, thoracolumbar region: Secondary | ICD-10-CM | POA: Diagnosis not present

## 2017-05-04 ENCOUNTER — Encounter: Payer: Self-pay | Admitting: Neurology

## 2017-05-06 ENCOUNTER — Encounter: Payer: Self-pay | Admitting: Neurology

## 2017-05-06 ENCOUNTER — Ambulatory Visit (INDEPENDENT_AMBULATORY_CARE_PROVIDER_SITE_OTHER): Payer: Federal, State, Local not specified - PPO | Admitting: Neurology

## 2017-05-06 VITALS — BP 129/81 | HR 94 | Ht 68.0 in | Wt 182.0 lb

## 2017-05-06 DIAGNOSIS — M545 Low back pain: Secondary | ICD-10-CM | POA: Diagnosis not present

## 2017-05-06 DIAGNOSIS — M546 Pain in thoracic spine: Secondary | ICD-10-CM | POA: Diagnosis not present

## 2017-05-06 DIAGNOSIS — G2581 Restless legs syndrome: Secondary | ICD-10-CM | POA: Diagnosis not present

## 2017-05-06 DIAGNOSIS — M4325 Fusion of spine, thoracolumbar region: Secondary | ICD-10-CM | POA: Diagnosis not present

## 2017-05-06 DIAGNOSIS — M549 Dorsalgia, unspecified: Secondary | ICD-10-CM | POA: Diagnosis not present

## 2017-05-06 DIAGNOSIS — G4733 Obstructive sleep apnea (adult) (pediatric): Secondary | ICD-10-CM | POA: Diagnosis not present
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
## 2017-05-06 NOTE — Patient Instructions (Signed)
I appreciate you trying CPAP, as your initial treatment option for obstructive sleep apnea. As discussed, your sleep study results from your sleep study in April indicated moderate obstructive sleep apnea by number of events. Unfortunately, you have struggled with CPAP. You have looked into trying an oral appliance. I will send my records to Dr. Myrtis Ser at this time for you to be considered for a custom-made oral appliance for treatment of obstructive sleep apnea. You have your sleep study results. I will see you back at this point on an as-needed basis. I would be happy to retry with you CPAP therapy in the future if you choose to go back on CPAP treatment. I can also see you back should have bothersome restless legs.   Please get in touch with your DME company, about returning your CPAP machine.

## 2017-05-08 DIAGNOSIS — M4326 Fusion of spine, lumbar region: Secondary | ICD-10-CM | POA: Diagnosis not present

## 2017-05-08 DIAGNOSIS — M542 Cervicalgia: Secondary | ICD-10-CM | POA: Diagnosis not present

## 2017-05-08 DIAGNOSIS — G4733 Obstructive sleep apnea (adult) (pediatric): Secondary | ICD-10-CM | POA: Diagnosis not present

## 2017-05-08 DIAGNOSIS — M4322 Fusion of spine, cervical region: Secondary | ICD-10-CM | POA: Diagnosis not present

## 2017-05-08 DIAGNOSIS — M4324 Fusion of spine, thoracic region: Secondary | ICD-10-CM | POA: Diagnosis not present

## 2017-05-09 DIAGNOSIS — M4325 Fusion of spine, thoracolumbar region: Secondary | ICD-10-CM | POA: Diagnosis not present

## 2017-05-09 DIAGNOSIS — M546 Pain in thoracic spine: Secondary | ICD-10-CM | POA: Diagnosis not present

## 2017-05-09 DIAGNOSIS — M545 Low back pain: Secondary | ICD-10-CM | POA: Diagnosis not present

## 2017-05-09 DIAGNOSIS — M549 Dorsalgia, unspecified: Secondary | ICD-10-CM | POA: Diagnosis not present

## 2017-05-12 DIAGNOSIS — M545 Low back pain: Secondary | ICD-10-CM | POA: Diagnosis not present

## 2017-05-12 DIAGNOSIS — M4325 Fusion of spine, thoracolumbar region: Secondary | ICD-10-CM | POA: Diagnosis not present

## 2017-05-12 DIAGNOSIS — M546 Pain in thoracic spine: Secondary | ICD-10-CM | POA: Diagnosis not present

## 2017-05-12 DIAGNOSIS — M549 Dorsalgia, unspecified: Secondary | ICD-10-CM | POA: Diagnosis not present

## 2017-05-14 DIAGNOSIS — M4325 Fusion of spine, thoracolumbar region: Secondary | ICD-10-CM | POA: Diagnosis not present

## 2017-05-14 DIAGNOSIS — M545 Low back pain: Secondary | ICD-10-CM | POA: Diagnosis not present

## 2017-05-14 DIAGNOSIS — M549 Dorsalgia, unspecified: Secondary | ICD-10-CM | POA: Diagnosis not present

## 2017-05-14 DIAGNOSIS — M546 Pain in thoracic spine: Secondary | ICD-10-CM | POA: Diagnosis not present

## 2017-05-19 DIAGNOSIS — M546 Pain in thoracic spine: Secondary | ICD-10-CM | POA: Diagnosis not present

## 2017-05-19 DIAGNOSIS — M549 Dorsalgia, unspecified: Secondary | ICD-10-CM | POA: Diagnosis not present

## 2017-05-19 DIAGNOSIS — M545 Low back pain: Secondary | ICD-10-CM | POA: Diagnosis not present

## 2017-05-19 DIAGNOSIS — M4325 Fusion of spine, thoracolumbar region: Secondary | ICD-10-CM | POA: Diagnosis not present

## 2017-05-21 DIAGNOSIS — M549 Dorsalgia, unspecified: Secondary | ICD-10-CM | POA: Diagnosis not present

## 2017-05-21 DIAGNOSIS — M546 Pain in thoracic spine: Secondary | ICD-10-CM | POA: Diagnosis not present

## 2017-05-21 DIAGNOSIS — M4325 Fusion of spine, thoracolumbar region: Secondary | ICD-10-CM | POA: Diagnosis not present

## 2017-05-21 DIAGNOSIS — M545 Low back pain: Secondary | ICD-10-CM | POA: Diagnosis not present

## 2017-05-26 DIAGNOSIS — K080 Exfoliation of teeth due to systemic causes: Secondary | ICD-10-CM | POA: Diagnosis not present

## 2017-05-27 DIAGNOSIS — M546 Pain in thoracic spine: Secondary | ICD-10-CM | POA: Diagnosis not present

## 2017-05-27 DIAGNOSIS — M4325 Fusion of spine, thoracolumbar region: Secondary | ICD-10-CM | POA: Diagnosis not present

## 2017-05-27 DIAGNOSIS — M549 Dorsalgia, unspecified: Secondary | ICD-10-CM | POA: Diagnosis not present

## 2017-05-27 DIAGNOSIS — M545 Low back pain: Secondary | ICD-10-CM | POA: Diagnosis not present

## 2017-05-28 DIAGNOSIS — M4325 Fusion of spine, thoracolumbar region: Secondary | ICD-10-CM | POA: Diagnosis not present

## 2017-05-28 DIAGNOSIS — G894 Chronic pain syndrome: Secondary | ICD-10-CM | POA: Diagnosis not present

## 2017-05-28 DIAGNOSIS — M4322 Fusion of spine, cervical region: Secondary | ICD-10-CM | POA: Diagnosis not present

## 2017-05-28 DIAGNOSIS — Z79899 Other long term (current) drug therapy: Secondary | ICD-10-CM | POA: Diagnosis not present

## 2017-05-30 DIAGNOSIS — M545 Low back pain: Secondary | ICD-10-CM | POA: Diagnosis not present

## 2017-05-30 DIAGNOSIS — M4325 Fusion of spine, thoracolumbar region: Secondary | ICD-10-CM | POA: Diagnosis not present

## 2017-05-30 DIAGNOSIS — M546 Pain in thoracic spine: Secondary | ICD-10-CM | POA: Diagnosis not present

## 2017-05-30 DIAGNOSIS — M549 Dorsalgia, unspecified: Secondary | ICD-10-CM | POA: Diagnosis not present

## 2017-09-01 DIAGNOSIS — G4733 Obstructive sleep apnea (adult) (pediatric): Secondary | ICD-10-CM | POA: Diagnosis not present

## 2017-09-20 IMAGING — MR MR THORACIC SPINE W/O CM
4 of 10 series · 14 of 48 positions shown · non-contrast
Comparison: None.

CLINICAL DATA: Chronic mid and low back pain. Scoliosis.
Preoperative examination. Initial encounter.

EXAM:
MRI THORACIC SPINE WITHOUT CONTRAST
TECHNIQUE: Multiplanar, multisequence MR imaging of the thoracic spine was
performed. No intravenous contrast was administered.

[Series 10: T2 · sagittal · 4.0mm · 0.56mm/px · 5 of 25 slices shown (1 of 4)]
[im 1/25]
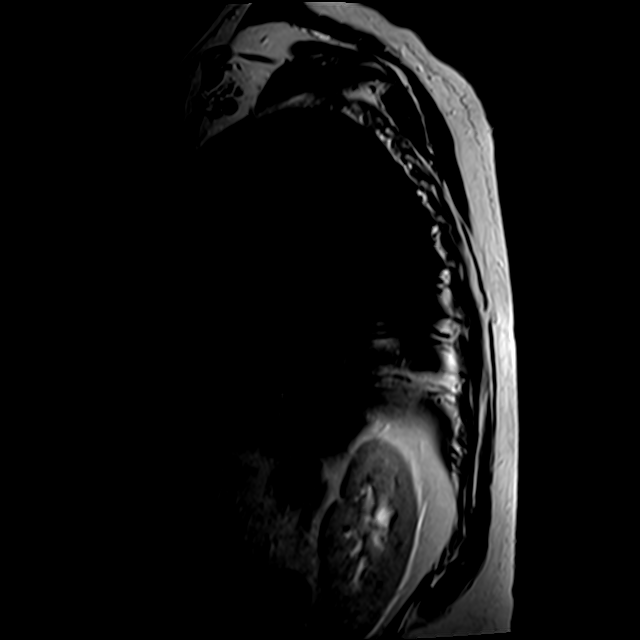
[im 7/25]
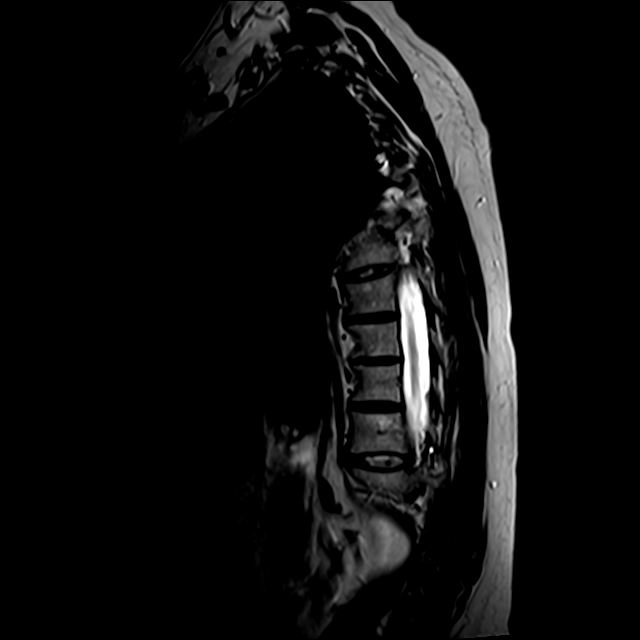
[im 13/25]
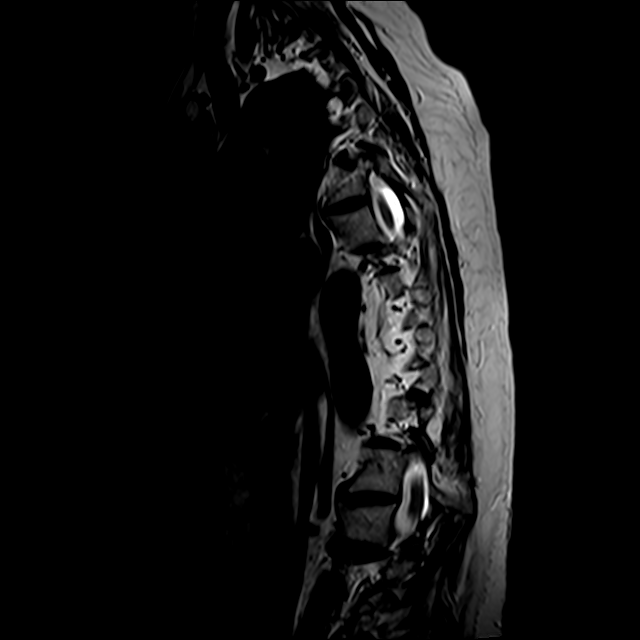
[im 19/25]
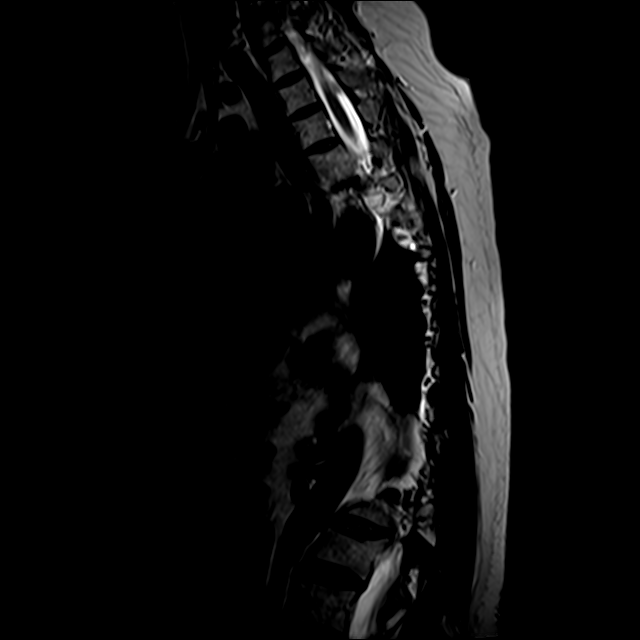
[im 25/25]
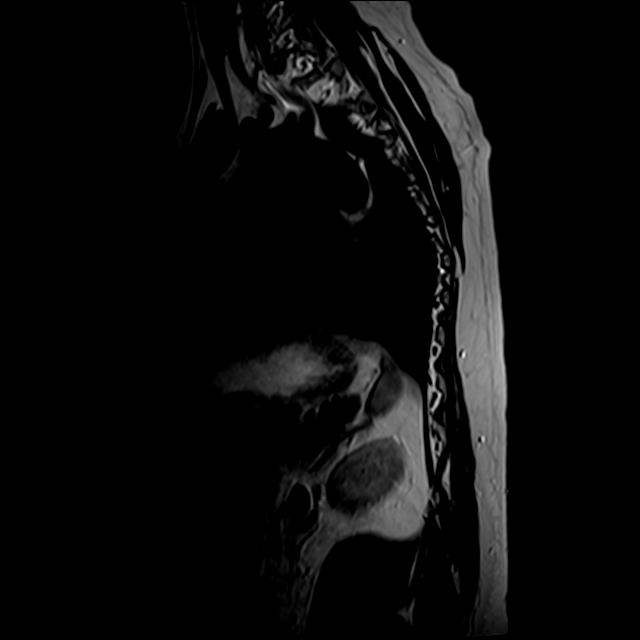

[Series 11: T2 · coronal · 5.0mm · 0.56mm/px · 3 of 19 slices shown (2 of 4)]
[im 1/19]
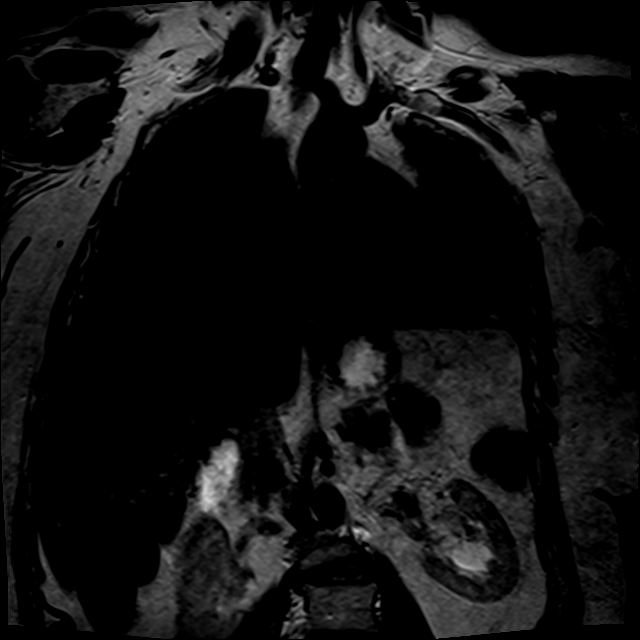
[im 13/19]
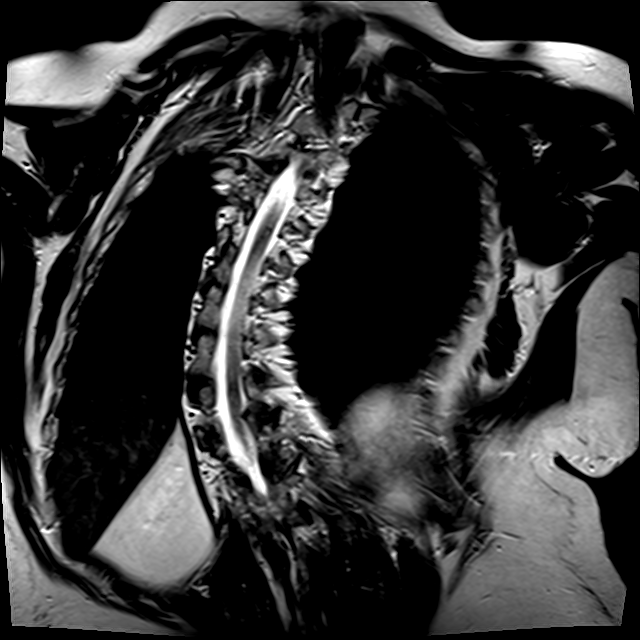
[im 19/19]
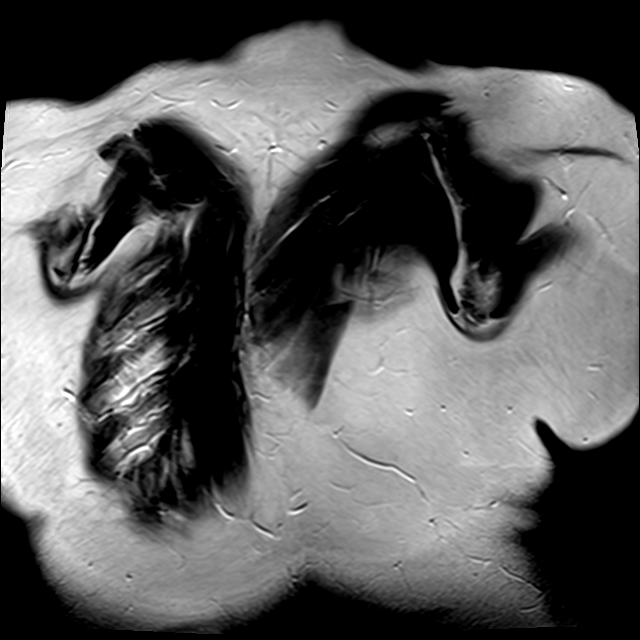

[Series 15: T2 · axial · 3.0mm · 0.25mm/px · z∈[-329,-115]mm · 3 of 42 slices shown (3 of 4)]
[im 6/42]
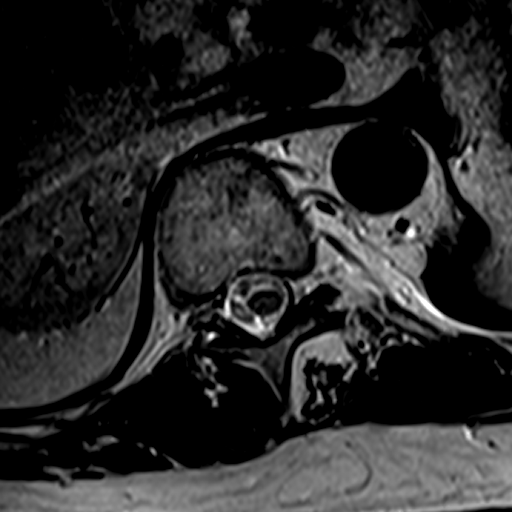
[im 24/42]
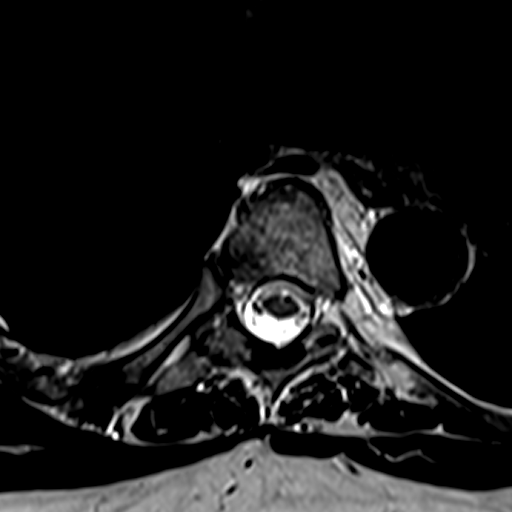
[im 36/42]
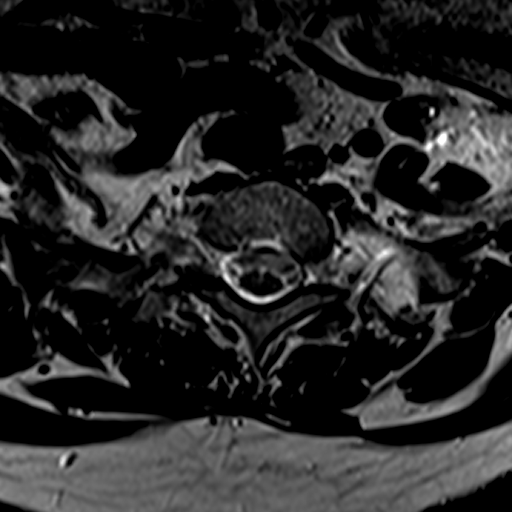

[Series 16: T2 · sagittal · 4.0mm · 0.56mm/px · 3 of 13 slices shown (4 of 4)]
[im 1/13]
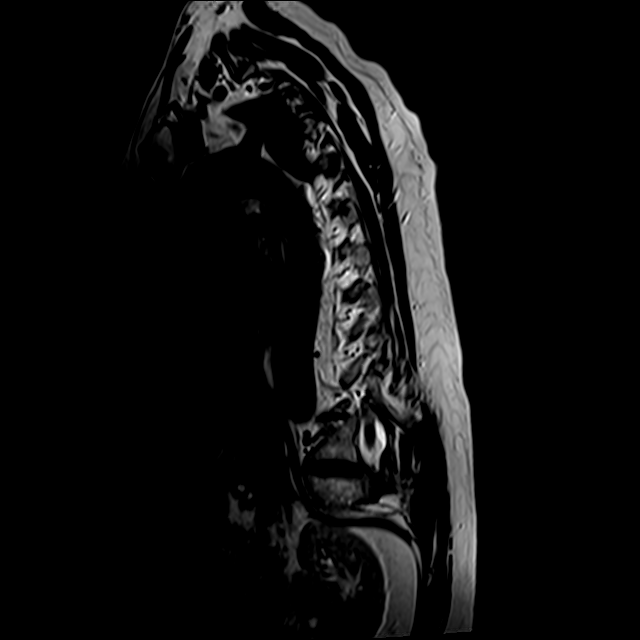
[im 7/13]
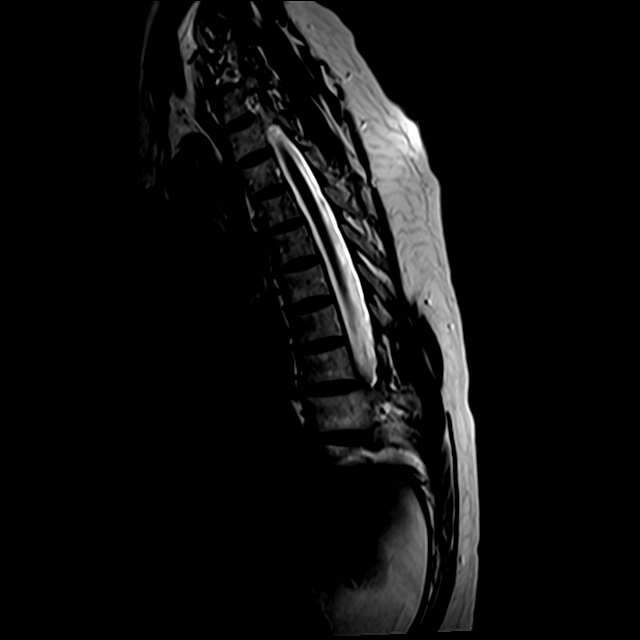
[im 13/13]
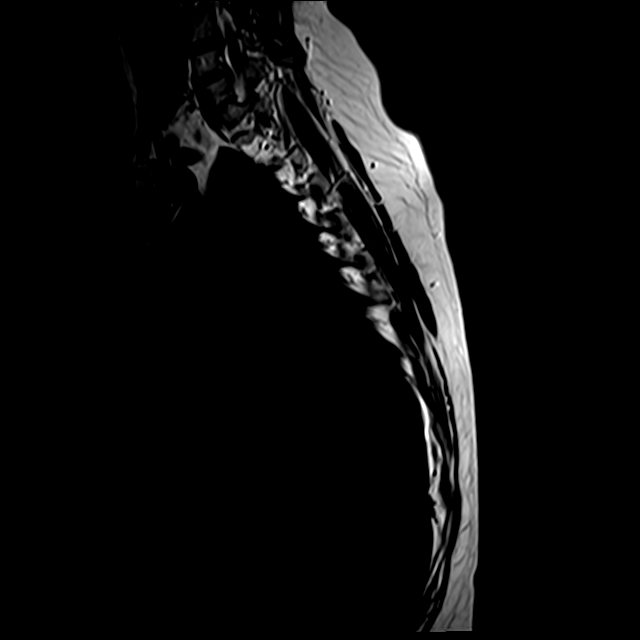

[14 of 48 positions shown; findings below may reference images not displayed]

FINDINGS: The patient has severe S shaped thoracolumbar scoliosis. Curvature
is convex to the right in the thoracic spine with the apex at
approximately T9-10. Degree of curvature is approximately 45 degrees
although accurate assessment cannot be made with MRI. The thoracic
cord demonstrates normal signal. No disc bulge or protrusion is
identified in the thoracic spine. The patient does have a
broad-based disc bulge eccentric to the left at C6-7 and
uncovertebral disease on the left. The ventral thecal sac is
effaced. Marked left foraminal narrowing is present which could
impact the exiting left C7 root. Imaged paraspinous structures are
unremarkable.
IMPRESSION: Marked S shaped thoracolumbar scoliosis convex to the right in the
thoracic spine with the apex approximately T9-10.

Widely patent central canal and foramina at all levels of the
thoracic spine with scattered facet degenerative change noted.

Disc bulge and left much worse than right uncovertebral disease at
C6-7. There is marked left foraminal narrowing which could impact
the exiting left C7 root. The ventral thecal sac is effaced.

## 2017-10-02 DIAGNOSIS — L821 Other seborrheic keratosis: Secondary | ICD-10-CM | POA: Diagnosis not present

## 2017-10-02 DIAGNOSIS — D225 Melanocytic nevi of trunk: Secondary | ICD-10-CM | POA: Diagnosis not present

## 2017-10-02 DIAGNOSIS — L820 Inflamed seborrheic keratosis: Secondary | ICD-10-CM | POA: Diagnosis not present

## 2017-11-17 DIAGNOSIS — K080 Exfoliation of teeth due to systemic causes: Secondary | ICD-10-CM | POA: Diagnosis not present

## 2017-11-21 DIAGNOSIS — K080 Exfoliation of teeth due to systemic causes: Secondary | ICD-10-CM | POA: Diagnosis not present

## 2017-12-02 DIAGNOSIS — K080 Exfoliation of teeth due to systemic causes: Secondary | ICD-10-CM | POA: Diagnosis not present

## 2017-12-11 DIAGNOSIS — M1990 Unspecified osteoarthritis, unspecified site: Secondary | ICD-10-CM | POA: Diagnosis not present

## 2017-12-11 DIAGNOSIS — M546 Pain in thoracic spine: Secondary | ICD-10-CM | POA: Diagnosis not present

## 2017-12-11 DIAGNOSIS — M791 Myalgia, unspecified site: Secondary | ICD-10-CM | POA: Diagnosis not present

## 2017-12-11 DIAGNOSIS — M4325 Fusion of spine, thoracolumbar region: Secondary | ICD-10-CM | POA: Diagnosis not present

## 2018-01-22 DIAGNOSIS — Z79899 Other long term (current) drug therapy: Secondary | ICD-10-CM | POA: Diagnosis not present

## 2018-01-22 DIAGNOSIS — G894 Chronic pain syndrome: Secondary | ICD-10-CM | POA: Diagnosis not present

## 2018-01-22 DIAGNOSIS — M542 Cervicalgia: Secondary | ICD-10-CM | POA: Diagnosis not present

## 2018-01-22 DIAGNOSIS — M4325 Fusion of spine, thoracolumbar region: Secondary | ICD-10-CM | POA: Diagnosis not present

## 2018-03-02 DIAGNOSIS — Z01419 Encounter for gynecological examination (general) (routine) without abnormal findings: Secondary | ICD-10-CM | POA: Diagnosis not present

## 2018-03-02 DIAGNOSIS — Z0000 Encounter for general adult medical examination without abnormal findings: Secondary | ICD-10-CM | POA: Diagnosis not present

## 2018-03-02 DIAGNOSIS — Z6828 Body mass index (BMI) 28.0-28.9, adult: Secondary | ICD-10-CM | POA: Diagnosis not present

## 2018-03-02 DIAGNOSIS — N951 Menopausal and female climacteric states: Secondary | ICD-10-CM | POA: Diagnosis not present

## 2018-03-11 DIAGNOSIS — Z6828 Body mass index (BMI) 28.0-28.9, adult: Secondary | ICD-10-CM | POA: Diagnosis not present

## 2018-03-11 DIAGNOSIS — Z1231 Encounter for screening mammogram for malignant neoplasm of breast: Secondary | ICD-10-CM | POA: Diagnosis not present

## 2018-06-02 ENCOUNTER — Encounter: Payer: Self-pay | Admitting: Gastroenterology

## 2018-12-24 DIAGNOSIS — Z981 Arthrodesis status: Secondary | ICD-10-CM | POA: Diagnosis not present

## 2018-12-24 DIAGNOSIS — M4322 Fusion of spine, cervical region: Secondary | ICD-10-CM | POA: Diagnosis not present

## 2018-12-24 DIAGNOSIS — M4324 Fusion of spine, thoracic region: Secondary | ICD-10-CM | POA: Diagnosis not present

## 2018-12-24 DIAGNOSIS — M4184 Other forms of scoliosis, thoracic region: Secondary | ICD-10-CM | POA: Diagnosis not present

## 2018-12-28 DIAGNOSIS — M4324 Fusion of spine, thoracic region: Secondary | ICD-10-CM | POA: Diagnosis not present

## 2018-12-28 DIAGNOSIS — M4184 Other forms of scoliosis, thoracic region: Secondary | ICD-10-CM | POA: Diagnosis not present

## 2018-12-28 DIAGNOSIS — M4322 Fusion of spine, cervical region: Secondary | ICD-10-CM | POA: Diagnosis not present

## 2018-12-28 DIAGNOSIS — Z981 Arthrodesis status: Secondary | ICD-10-CM | POA: Diagnosis not present

## 2019-01-05 DIAGNOSIS — M4184 Other forms of scoliosis, thoracic region: Secondary | ICD-10-CM | POA: Diagnosis not present

## 2019-01-05 DIAGNOSIS — Z981 Arthrodesis status: Secondary | ICD-10-CM | POA: Diagnosis not present

## 2019-01-05 DIAGNOSIS — M4322 Fusion of spine, cervical region: Secondary | ICD-10-CM | POA: Diagnosis not present

## 2019-01-05 DIAGNOSIS — M4324 Fusion of spine, thoracic region: Secondary | ICD-10-CM | POA: Diagnosis not present

## 2019-01-08 DIAGNOSIS — Z981 Arthrodesis status: Secondary | ICD-10-CM | POA: Diagnosis not present

## 2019-01-08 DIAGNOSIS — M4322 Fusion of spine, cervical region: Secondary | ICD-10-CM | POA: Diagnosis not present

## 2019-01-08 DIAGNOSIS — M4324 Fusion of spine, thoracic region: Secondary | ICD-10-CM | POA: Diagnosis not present

## 2019-01-08 DIAGNOSIS — M4184 Other forms of scoliosis, thoracic region: Secondary | ICD-10-CM | POA: Diagnosis not present

## 2019-01-08 DIAGNOSIS — R6882 Decreased libido: Secondary | ICD-10-CM | POA: Diagnosis not present

## 2019-01-08 DIAGNOSIS — Z6828 Body mass index (BMI) 28.0-28.9, adult: Secondary | ICD-10-CM | POA: Diagnosis not present

## 2019-01-14 DIAGNOSIS — M4322 Fusion of spine, cervical region: Secondary | ICD-10-CM | POA: Diagnosis not present

## 2019-01-14 DIAGNOSIS — Z981 Arthrodesis status: Secondary | ICD-10-CM | POA: Diagnosis not present

## 2019-01-14 DIAGNOSIS — M4184 Other forms of scoliosis, thoracic region: Secondary | ICD-10-CM | POA: Diagnosis not present

## 2019-01-14 DIAGNOSIS — M4324 Fusion of spine, thoracic region: Secondary | ICD-10-CM | POA: Diagnosis not present

## 2019-02-13 ENCOUNTER — Other Ambulatory Visit: Payer: Self-pay

## 2019-02-13 ENCOUNTER — Ambulatory Visit (HOSPITAL_COMMUNITY)
Admission: EM | Admit: 2019-02-13 | Discharge: 2019-02-13 | Disposition: A | Discharge status: Home / Self Care | Payer: Federal, State, Local not specified - PPO | Attending: Physician Assistant | Admitting: Physician Assistant

## 2019-02-13 ENCOUNTER — Telehealth: Payer: Self-pay

## 2019-02-13 ENCOUNTER — Encounter (HOSPITAL_COMMUNITY): Payer: Self-pay | Admitting: Emergency Medicine

## 2019-02-13 DIAGNOSIS — R6889 Other general symptoms and signs: Secondary | ICD-10-CM | POA: Insufficient documentation

## 2019-02-13 DIAGNOSIS — R05 Cough: Secondary | ICD-10-CM | POA: Insufficient documentation

## 2019-02-13 DIAGNOSIS — J029 Acute pharyngitis, unspecified: Secondary | ICD-10-CM | POA: Insufficient documentation

## 2019-02-13 DIAGNOSIS — Z20828 Contact with and (suspected) exposure to other viral communicable diseases: Secondary | ICD-10-CM | POA: Diagnosis not present

## 2019-02-13 DIAGNOSIS — Z20822 Contact with and (suspected) exposure to covid-19: Secondary | ICD-10-CM

## 2019-02-13 DIAGNOSIS — R059 Cough, unspecified: Secondary | ICD-10-CM

## 2019-02-13 LAB — POCT RAPID STREP A: Streptococcus, Group A Screen (Direct): NEGATIVE

## 2019-02-13 MED ORDER — IPRATROPIUM BROMIDE 0.06 % NA SOLN: 2.0000 | Freq: Four times a day (QID) | NASAL | 0 refills | Status: DC

## 2019-02-13 NOTE — Telephone Encounter (Signed)
-----   Message from Ok Edwards, [place] sent at 02/13/2019 12:22 PM EDT ----- Regarding: Need COVID testing 2 day history of sore throat, cough, rhinorrhea, nasal congestion. No fever, shortness of breath. Daughter awaiting COVID testing result. Daughter with positive exposure, but without any symptoms.

## 2019-02-13 NOTE — ED Provider Notes (Addendum)
[facility]    CSN: [id number] Arrival date & time: 02/13/19  1137     History   Chief Complaint Chief Complaint  Patient presents with  . Sore Throat    appt 1150    HPI Heather Davis is a 53 y.o. female.   53 year old female comes in for 2 day history of URI symptoms. Has had sore throat, cough, rhinorrhea, nasal congestion. Denies body aches, fever, chills. Denies abdominal pain, nausea, vomiting. States has mild loss of taste/smell, but could be due to nasal congestion. Denies shortness of breath, wheezing. Denies trouble swallowing, swelling of the throat, tripoding, drooling, trismus. Has been taking allergy medicine without relief. Never smoker.  Daughter is currently awaiting COVID testing results. She is asymptomatic, but recently had positive COVID exposure. Patient also recently returned from the beach.      Past Medical History:  Diagnosis Date  . Major depression in remission ([place])   . Scoliosis   . Varicose veins     Patient Active Problem List   Diagnosis Date Noted  . Varicose veins of leg with complications 50/27/7412  . Varicose veins of bilateral lower extremities with other complications 87/86/7672    Past Surgical History:  Procedure Laterality Date  . APPENDECTOMY    . BACK SURGERY  09/2015  . CERVICAL FUSION    . CESAREAN SECTION    . CHOLECYSTECTOMY    . PANNICULECTOMY      OB History   No obstetric history on file.      Home Medications    Prior to Admission medications   Medication Sig Start Date End Date Taking? Authorizing Provider  ALPRAZolam (XANAX) 0.25 MG tablet Take 0.25 mg by mouth 3 (three) times daily as needed.  01/15/16   [provider]  citalopram (CELEXA) 40 MG tablet Take 20 mg by mouth. 09/20/15   [provider]  estradiol cypionate (DEPO-ESTRADIOL) 5 MG/ML injection Inject into the muscle.    [provider]  ipratropium (ATROVENT) 0.06 % nasal spray Place 2 sprays into both  nostrils 4 (four) times daily. 02/13/19   Ok Edwards, PA-C  MedroxyPROGESTERone Acetate 150 MG/ML SUSY  04/29/16   [provider]  methocarbamol (ROBAXIN) 750 MG tablet Take 750 mg by mouth daily as needed.  10/06/15   [provider]  traMADol (ULTRAM) 50 MG tablet Take by mouth every 6 (six) hours as needed.    [provider]    Family History Family History  Problem Relation Age of Onset  . Diabetes Mother   . Heart disease Mother   . Hyperlipidemia Mother     Social History Social History   Tobacco Use  . Smoking status: Never Smoker  . Smokeless tobacco: Never Used  Substance Use Topics  . Alcohol use: Yes    Comment: 2 glasses wine a month  . Drug use: No     Allergies   Patient has no known allergies.   Review of Systems Review of Systems  Reason unable to perform ROS: See HPI as above.     Physical Exam Triage Vital Signs ED Triage Vitals [02/13/19 1151]  Enc Vitals Group     BP 127/83     Pulse Rate 99     Resp 18     Temp 98.7 F (37.1 C)     Temp Source Oral     SpO2 96 %     Weight      Height  Head Circumference      Peak Flow      Pain Score 3     Pain Loc      Pain Edu?      Excl. in GC?    No data found.  Updated Vital Signs BP 127/83 (BP Location: Right Arm)   Pulse 99   Temp 98.7 F (37.1 C) (Oral)   Resp 18   SpO2 96%   Visual Acuity Right Eye Distance:   Left Eye Distance:   Bilateral Distance:    Right Eye Near:   Left Eye Near:    Bilateral Near:     Physical Exam Constitutional:      General: She is not in acute distress.    Appearance: Normal appearance. She is not ill-appearing, toxic-appearing or diaphoretic.  HENT:     Head: Normocephalic and atraumatic.     Mouth/Throat:     Mouth: Mucous membranes are moist.     Pharynx: Oropharynx is clear. Uvula midline. Posterior oropharyngeal erythema present.     Tonsils: No tonsillar exudate. 2+ on the right. 2+ on the left.  Neck:      Musculoskeletal: Normal range of motion and neck supple.  Cardiovascular:     Rate and Rhythm: Normal rate and regular rhythm.     Heart sounds: Normal heart sounds. No murmur. No friction rub. No gallop.   Pulmonary:     Effort: Pulmonary effort is normal. No accessory muscle usage, prolonged expiration, respiratory distress or retractions.     Comments: Lungs clear to auscultation without adventitious lung sounds. Neurological:     General: No focal deficit present.     Mental Status: She is alert and oriented to person, place, and time.      UC Treatments / Results  Labs (all labs ordered are listed, but only abnormal results are displayed) Labs Reviewed  CULTURE, GROUP A STREP [facility])  POCT RAPID STREP A    EKG None  Radiology No results found.  Procedures Procedures (including critical care time)  Medications Ordered in UC Medications - No data to display  Initial Impression / Assessment and Plan / UC Course  I have reviewed the triage vital signs and the nursing notes.  Pertinent labs & imaging results that were available during my care of the patient were reviewed by me and considered in my medical decision making (see chart for details).    Rapid strep negative. COVID testing ordered. Will have patient continue with symptomatic treatment. Return precautions given. Patient expresses understanding and agrees to plan.  Final Clinical Impressions(s) / UC Diagnoses   Final diagnoses:  Sore throat  Cough  Suspected Covid-19 Virus Infection   ED Prescriptions    Medication Sig Dispense Auth. Provider   ipratropium (ATROVENT) 0.06 % nasal spray Place 2 sprays into both nostrils 4 (four) times daily. 15 mL Idamae LusherYu, Tameem Pullara V, PA-C        Nila Winker V, PA-C 02/13/19 1249    Belinda FisherYu, Taequan Stockhausen V, PA-C 02/13/19 1250

## 2019-02-13 NOTE — Telephone Encounter (Signed)
Called pt to schedule appt for testing for Covid and pt stated that she was tested today at the [facility] in [place].

## 2019-02-13 NOTE — ED Triage Notes (Signed)
Pt here for sore throat x 2 days; pt sts daughter is awaiting covid test results

## 2019-02-13 NOTE — Discharge Instructions (Addendum)
Covid testing ordered. You will be contacted for testing. Rapid strep negative. Continue allergy medicine. Atrovent for nasal congestion/drainage. You can use over the counter nasal saline rinse such as neti pot for nasal congestion.   I would like you to quarantine until testing results return. Continue to monitor, you can call COVID hotline [phone]) or use [facility] visit online if symptoms worsens to determine where you should seek care. If experiencing shortness of breath, trouble breathing, call 911 and provide them with your current situation.

## 2019-02-15 LAB — CULTURE, GROUP A STREP (THRC)

## 2019-03-08 DIAGNOSIS — Z0000 Encounter for general adult medical examination without abnormal findings: Secondary | ICD-10-CM | POA: Diagnosis not present

## 2019-03-08 DIAGNOSIS — Z6829 Body mass index (BMI) 29.0-29.9, adult: Secondary | ICD-10-CM | POA: Diagnosis not present

## 2019-03-08 DIAGNOSIS — Z124 Encounter for screening for malignant neoplasm of cervix: Secondary | ICD-10-CM | POA: Diagnosis not present

## 2019-03-08 DIAGNOSIS — Z01419 Encounter for gynecological examination (general) (routine) without abnormal findings: Secondary | ICD-10-CM | POA: Diagnosis not present

## 2019-04-06 DIAGNOSIS — Z124 Encounter for screening for malignant neoplasm of cervix: Secondary | ICD-10-CM | POA: Diagnosis not present

## 2019-04-06 DIAGNOSIS — Z0000 Encounter for general adult medical examination without abnormal findings: Secondary | ICD-10-CM | POA: Diagnosis not present

## 2019-04-06 DIAGNOSIS — Z1211 Encounter for screening for malignant neoplasm of colon: Secondary | ICD-10-CM | POA: Diagnosis not present

## 2019-04-13 DIAGNOSIS — Z1231 Encounter for screening mammogram for malignant neoplasm of breast: Secondary | ICD-10-CM | POA: Diagnosis not present

## 2019-04-13 DIAGNOSIS — D649 Anemia, unspecified: Secondary | ICD-10-CM | POA: Diagnosis not present

## 2019-07-13 DIAGNOSIS — G2581 Restless legs syndrome: Secondary | ICD-10-CM | POA: Diagnosis not present

## 2019-07-13 DIAGNOSIS — Z1331 Encounter for screening for depression: Secondary | ICD-10-CM | POA: Diagnosis not present

## 2019-07-13 DIAGNOSIS — Z20818 Contact with and (suspected) exposure to other bacterial communicable diseases: Secondary | ICD-10-CM | POA: Diagnosis not present

## 2019-07-13 DIAGNOSIS — D649 Anemia, unspecified: Secondary | ICD-10-CM | POA: Diagnosis not present

## 2019-07-13 DIAGNOSIS — F325 Major depressive disorder, single episode, in full remission: Secondary | ICD-10-CM | POA: Diagnosis not present

## 2019-07-13 DIAGNOSIS — M419 Scoliosis, unspecified: Secondary | ICD-10-CM | POA: Diagnosis not present

## 2019-07-13 DIAGNOSIS — Z0000 Encounter for general adult medical examination without abnormal findings: Secondary | ICD-10-CM | POA: Diagnosis not present

## 2019-07-13 DIAGNOSIS — N951 Menopausal and female climacteric states: Secondary | ICD-10-CM | POA: Diagnosis not present

## 2019-07-25 ENCOUNTER — Encounter: Payer: Self-pay | Admitting: Emergency Medicine

## 2019-07-25 ENCOUNTER — Ambulatory Visit
Admission: EM | Admit: 2019-07-25 | Discharge: 2019-07-25 | Disposition: A | Discharge status: Home / Self Care | Payer: Federal, State, Local not specified - PPO | Attending: Emergency Medicine | Admitting: Emergency Medicine

## 2019-07-25 ENCOUNTER — Other Ambulatory Visit: Payer: Self-pay

## 2019-07-25 ENCOUNTER — Ambulatory Visit (INDEPENDENT_AMBULATORY_CARE_PROVIDER_SITE_OTHER): Payer: Federal, State, Local not specified - PPO

## 2019-07-25 DIAGNOSIS — S8261XA Displaced fracture of lateral malleolus of right fibula, initial encounter for closed fracture: Secondary | ICD-10-CM | POA: Diagnosis not present

## 2019-07-25 DIAGNOSIS — X501XXA Overexertion from prolonged static or awkward postures, initial encounter: Secondary | ICD-10-CM | POA: Diagnosis not present

## 2019-07-25 DIAGNOSIS — M25571 Pain in right ankle and joints of right foot: Secondary | ICD-10-CM | POA: Diagnosis not present

## 2019-07-25 NOTE — ED Provider Notes (Signed)
[facility]    CSN: [id number] Arrival date & time: 07/25/19  3646      History   Chief Complaint Chief Complaint  Patient presents with  . Ankle Pain    HPI Heather Davis is a 53 y.o. female presenting for right ankle pain, swelling after inversion injury while wearing heels last night.  Has tried icing, Tylenol without significant relief.  Denies previous fracture, frequent ankle inversion injuries.  Past Medical History:  Diagnosis Date  . Major depression in remission (HCC)   . Scoliosis   . Varicose veins     Patient Active Problem List   Diagnosis Date Noted  . Varicose veins of leg with complications 05/13/2016  . Varicose veins of bilateral lower extremities with other complications 05/06/2016    Past Surgical History:  Procedure Laterality Date  . APPENDECTOMY    . BACK SURGERY  09/2015  . CERVICAL FUSION    . CESAREAN SECTION    . CHOLECYSTECTOMY    . PANNICULECTOMY      OB History   No obstetric history on file.      Home Medications    Prior to Admission medications   Medication Sig Start Date End Date Taking? Authorizing Provider  ALPRAZolam (XANAX) 0.25 MG tablet Take 0.25 mg by mouth 3 (three) times daily as needed.  01/15/16   [provider]  citalopram (CELEXA) 40 MG tablet Take 20 mg by mouth. 09/20/15   [provider]  estradiol cypionate (DEPO-ESTRADIOL) 5 MG/ML injection Inject into the muscle.    [provider]  ipratropium (ATROVENT) 0.06 % nasal spray Place 2 sprays into both nostrils 4 (four) times daily. 02/13/19   Belinda Fisher, PA-C  MedroxyPROGESTERone Acetate 150 MG/ML SUSY  04/29/16   [provider]  methocarbamol (ROBAXIN) 750 MG tablet Take 750 mg by mouth daily as needed.  10/06/15   [provider]  traMADol (ULTRAM) 50 MG tablet Take by mouth every 6 (six) hours as needed.    [provider]    Family History Family History  Problem Relation Age of Onset  .  Diabetes Mother   . Heart disease Mother   . Hyperlipidemia Mother     Social History Social History   Tobacco Use  . Smoking status: Never Smoker  . Smokeless tobacco: Never Used  Substance Use Topics  . Alcohol use: Yes    Comment: 2 glasses wine a month  . Drug use: No     Allergies   Patient has no known allergies.   Review of Systems Review of Systems  Constitutional: Negative for fatigue and fever.  Respiratory: Negative for cough and shortness of breath.   Cardiovascular: Negative for chest pain and palpitations.  Musculoskeletal:       Positive for right ankle pain and swelling  Neurological: Negative for weakness and numbness.     Physical Exam Triage Vital Signs ED Triage Vitals  Enc Vitals Group     BP      Pulse      Resp      Temp      Temp src      SpO2      Weight      Height      Head Circumference      Peak Flow      Pain Score      Pain Loc      Pain Edu?      Excl. in  GC?    No data found.  Updated Vital Signs BP (!) 149/88 (BP Location: Right Arm)   Pulse 95   Temp (!) 97.3 F (36.3 C) (Temporal)   Resp 18   SpO2 94%   Visual Acuity Right Eye Distance:   Left Eye Distance:   Bilateral Distance:    Right Eye Near:   Left Eye Near:    Bilateral Near:     Physical Exam Constitutional:      General: She is not in acute distress. HENT:     Head: Normocephalic and atraumatic.  Eyes:     General: No scleral icterus.    Pupils: Pupils are equal, round, and reactive to light.  Cardiovascular:     Rate and Rhythm: Normal rate.  Pulmonary:     Effort: Pulmonary effort is normal.  Musculoskeletal:     Right ankle: She exhibits decreased range of motion, swelling and ecchymosis. She exhibits no deformity, no laceration and normal pulse. Tenderness. Lateral malleolus tenderness found. No CF ligament, no head of 5th metatarsal and no proximal fibula tenderness found. Achilles tendon exhibits no pain and no defect.  Skin:     Coloration: Skin is not jaundiced or pale.  Neurological:     Mental Status: She is alert and oriented to person, place, and time.      UC Treatments / Results  Labs (all labs ordered are listed, but only abnormal results are displayed) Labs Reviewed - No data to display  EKG   Radiology Dg Ankle Complete Right  Result Date: 07/25/2019 CLINICAL DATA:  Right ankle pain post inversion injury last night. Pain around lateral malleolus, constant throbbing pain, sharp pain when bearing weight. Swelling noted around lateral malleolus. Initial encounter. Nondiabetic. EXAM: RIGHT ANKLE - COMPLETE 3+ VIEW COMPARISON:  None. FINDINGS: Cortical avulsion fracture from the tip of the lateral malleolus. Overlying soft tissue swelling. Ankle mortise intact. Normal mineralization and alignment. Small calcaneal spur. IMPRESSION: Avulsion fracture from the tip of the lateral malleolus. Electronically Signed   By: Corlis Leak  Hassell M.D.   On: 07/25/2019 11:19    Procedures Procedures (including critical care time)  Medications Ordered in UC Medications - No data to display  Initial Impression / Assessment and Plan / UC Course  I have reviewed the triage vital signs and the nursing notes.  Pertinent labs & imaging results that were available during my care of the patient were reviewed by me and considered in my medical decision making (see chart for details).     Ankle x-ray done in office, reviewed by me radiology: Significant for avulsion fracture of lateral malleolus.  Patient given cam walker, crutches in office which she tolerated well.  Will practice RICE, pain management, follow-up with Ortho in 1 week.  Return precautions discussed, patient verbalized understanding and is agreeable to plan. Final Clinical Impressions(s) / UC Diagnoses   Final diagnoses:  Closed avulsion fracture of lateral malleolus of right fibula, initial encounter     Discharge Instructions     Recommend RICE: rest, ice,  compression, elevation as needed for pain.   Cold therapy (ice packs) can be used to help swelling both after injury and after prolonged use of areas of chronic pain/aches.  For pain: recommend 350 mg-1000 mg of Tylenol (acetaminophen) and/or 200 mg - 800 mg of Advil (ibuprofen, Motrin) every 8 hours as needed.  May alternate between the two throughout the day as they are generally safe to take together.  DO NOT exceed  more than 3000 mg of Tylenol or 3200 mg of ibuprofen in a 24 hour period as this could damage your stomach, kidneys, liver, or increase your bleeding risk.    ED Prescriptions    None     PDMP not reviewed this encounter.   Hall-Potvin, [place], [place] 07/25/19 1202

## 2019-07-25 NOTE — ED Triage Notes (Signed)
Pt presents to [facility] for assessment after twisting her ankle in boots with heels last night.  States the area is swollen, difficult to walk on.

## 2019-07-25 NOTE — ED Notes (Signed)
Patient able to ambulate independently  

## 2019-07-25 NOTE — Discharge Instructions (Addendum)
Recommend RICE: rest, ice, compression, elevation as needed for pain.   Cold therapy (ice packs) can be used to help swelling both after injury and after prolonged use of areas of chronic pain/aches.  For pain: recommend 350 mg-1000 mg of Tylenol (acetaminophen) and/or 200 mg - 800 mg of Advil (ibuprofen, Motrin) every 8 hours as needed.  May alternate between the two throughout the day as they are generally safe to take together.  DO NOT exceed more than 3000 mg of Tylenol or 3200 mg of ibuprofen in a 24 hour period as this could damage your stomach, kidneys, liver, or increase your bleeding risk. 

## 2019-08-02 DIAGNOSIS — S93491A Sprain of other ligament of right ankle, initial encounter: Secondary | ICD-10-CM | POA: Diagnosis not present

## 2019-08-23 DIAGNOSIS — Z20828 Contact with and (suspected) exposure to other viral communicable diseases: Secondary | ICD-10-CM | POA: Diagnosis not present

## 2019-08-25 DIAGNOSIS — S93491D Sprain of other ligament of right ankle, subsequent encounter: Secondary | ICD-10-CM | POA: Diagnosis not present

## 2019-09-16 DIAGNOSIS — L710 Perioral dermatitis: Secondary | ICD-10-CM | POA: Diagnosis not present

## 2019-11-02 DIAGNOSIS — Z981 Arthrodesis status: Secondary | ICD-10-CM | POA: Diagnosis not present

## 2019-11-02 DIAGNOSIS — M4324 Fusion of spine, thoracic region: Secondary | ICD-10-CM | POA: Diagnosis not present

## 2019-11-02 DIAGNOSIS — M4184 Other forms of scoliosis, thoracic region: Secondary | ICD-10-CM | POA: Diagnosis not present

## 2019-11-02 DIAGNOSIS — M4322 Fusion of spine, cervical region: Secondary | ICD-10-CM | POA: Diagnosis not present

## 2019-11-04 DIAGNOSIS — Z981 Arthrodesis status: Secondary | ICD-10-CM | POA: Diagnosis not present

## 2019-11-04 DIAGNOSIS — M4324 Fusion of spine, thoracic region: Secondary | ICD-10-CM | POA: Diagnosis not present

## 2019-11-04 DIAGNOSIS — M4322 Fusion of spine, cervical region: Secondary | ICD-10-CM | POA: Diagnosis not present

## 2019-11-04 DIAGNOSIS — M4184 Other forms of scoliosis, thoracic region: Secondary | ICD-10-CM | POA: Diagnosis not present

## 2019-11-09 DIAGNOSIS — M4184 Other forms of scoliosis, thoracic region: Secondary | ICD-10-CM | POA: Diagnosis not present

## 2019-11-09 DIAGNOSIS — M4322 Fusion of spine, cervical region: Secondary | ICD-10-CM | POA: Diagnosis not present

## 2019-11-09 DIAGNOSIS — M4324 Fusion of spine, thoracic region: Secondary | ICD-10-CM | POA: Diagnosis not present

## 2019-11-09 DIAGNOSIS — Z981 Arthrodesis status: Secondary | ICD-10-CM | POA: Diagnosis not present

## 2019-11-11 DIAGNOSIS — Z981 Arthrodesis status: Secondary | ICD-10-CM | POA: Diagnosis not present

## 2019-11-11 DIAGNOSIS — M4184 Other forms of scoliosis, thoracic region: Secondary | ICD-10-CM | POA: Diagnosis not present

## 2019-11-11 DIAGNOSIS — M4324 Fusion of spine, thoracic region: Secondary | ICD-10-CM | POA: Diagnosis not present

## 2019-11-11 DIAGNOSIS — M4322 Fusion of spine, cervical region: Secondary | ICD-10-CM | POA: Diagnosis not present

## 2019-11-17 DIAGNOSIS — Z981 Arthrodesis status: Secondary | ICD-10-CM | POA: Diagnosis not present

## 2019-11-17 DIAGNOSIS — M4184 Other forms of scoliosis, thoracic region: Secondary | ICD-10-CM | POA: Diagnosis not present

## 2019-11-17 DIAGNOSIS — M4322 Fusion of spine, cervical region: Secondary | ICD-10-CM | POA: Diagnosis not present

## 2019-11-17 DIAGNOSIS — M4324 Fusion of spine, thoracic region: Secondary | ICD-10-CM | POA: Diagnosis not present

## 2020-02-10 ENCOUNTER — Ambulatory Visit (INDEPENDENT_AMBULATORY_CARE_PROVIDER_SITE_OTHER): Payer: Federal, State, Local not specified - PPO | Admitting: General Surgery

## 2020-02-10 ENCOUNTER — Encounter: Payer: Self-pay | Admitting: General Surgery

## 2020-02-10 ENCOUNTER — Other Ambulatory Visit: Payer: Self-pay

## 2020-02-10 VITALS — BP 114/79 | HR 75 | Temp 97.4°F | Resp 18 | Ht 68.0 in | Wt 191.0 lb

## 2020-02-10 DIAGNOSIS — R599 Enlarged lymph nodes, unspecified: Secondary | ICD-10-CM

## 2020-02-11 NOTE — Progress Notes (Signed)
Heather Davis; [id number]; 01/28/1966   HPI Patient is a 54 year old white female who referred herself to my care for evaluation and treatment of a subcutaneous nodule above her left clavicle.  She states is been present for approximately 3 months.  No change in size is noted.  She has had no tenderness.  She found it soon after receiving a Covid vaccination.  She has 0 out of 10 pain.  No other subcutaneous masses have been noted.  Patient denies any fevers, night sweats.  She does have a cat but she has not been scratched recently.  She denies any generalized malaise.  Due for annual mammography in August of this year. Past Medical History:  Diagnosis Date   Major depression in remission ([place])    Scoliosis    Varicose veins     Past Surgical History:  Procedure Laterality Date   APPENDECTOMY     BACK SURGERY  09/2015   CERVICAL FUSION     CESAREAN SECTION     CHOLECYSTECTOMY     PANNICULECTOMY      Family History  Problem Relation Age of Onset   Diabetes Mother    Heart disease Mother    Hyperlipidemia Mother     Current Outpatient Medications on File Prior to Visit  Medication Sig Dispense Refill   ALPRAZolam (XANAX) 0.25 MG tablet Take 0.25 mg by mouth 3 (three) times daily as needed.      citalopram (CELEXA) 40 MG tablet Take 20 mg by mouth.     MedroxyPROGESTERone Acetate 150 MG/ML SUSY      estradiol (ESTRACE) 0.5 MG tablet Take 0.5 mg by mouth daily.     No current facility-administered medications on file prior to visit.    No Known Allergies  Social History   Substance and Sexual Activity  Alcohol Use Yes   Comment: 2 glasses wine a month    Social History   Tobacco Use  Smoking Status Never Smoker  Smokeless Tobacco Never Used    Review of Systems  Constitutional: Negative.   HENT: Negative.   Eyes: Negative.   Respiratory: Negative.   Cardiovascular: Negative.   Gastrointestinal: Negative.   Genitourinary: Negative.    Musculoskeletal: Negative.   Skin: Negative.   Neurological: Negative.   Endo/Heme/Allergies: Negative.   Psychiatric/Behavioral: Negative.     Objective   Vitals:   02/10/20 1131  BP: 114/79  Pulse: 75  Resp: 18  Temp: (!) 97.4 F (36.3 C)  SpO2: 97%    Physical Exam Vitals reviewed.  Constitutional:      Appearance: Normal appearance. She is not ill-appearing.  HENT:     Head: Normocephalic and atraumatic.  Cardiovascular:     Rate and Rhythm: Normal rate and regular rhythm.     Heart sounds: Normal heart sounds. No murmur heard.  No friction rub. No gallop.   Pulmonary:     Effort: Pulmonary effort is normal. No respiratory distress.     Breath sounds: Normal breath sounds. No stridor. No wheezing, rhonchi or rales.  Musculoskeletal:     Cervical back: Neck supple. No tenderness.  Lymphadenopathy:     Cervical: No cervical adenopathy.  Skin:    General: Skin is warm and dry.     Comments: Small pea-sized rubbery nontender mobile subcutaneous mass just above the mid left clavicle.  No other lymphadenopathy noted.  Both axillas negative for adenopathy.  Neurological:     Mental Status: She is alert and oriented to person, place,  and time.     Assessment  Benign subcutaneous lymph node, left supraclavicular.  I told her that this does not appear to be malignant in nature and is just an isolated finding.  I cannot tell whether this is related to her Covid injection. Plan   Continue to monitor with your primary care physician.  Should there be any change in size, do not hesitate to return to my office.  I do not think ultrasound of this lesion will be beneficial at this time given its small size.  She understands and agrees.  Follow-up here as needed.

## 2020-04-07 DIAGNOSIS — Z20822 Contact with and (suspected) exposure to covid-19: Secondary | ICD-10-CM | POA: Diagnosis not present

## 2020-05-16 DIAGNOSIS — Z01419 Encounter for gynecological examination (general) (routine) without abnormal findings: Secondary | ICD-10-CM | POA: Diagnosis not present

## 2020-05-16 DIAGNOSIS — Z6828 Body mass index (BMI) 28.0-28.9, adult: Secondary | ICD-10-CM | POA: Diagnosis not present

## 2020-05-16 DIAGNOSIS — Z1231 Encounter for screening mammogram for malignant neoplasm of breast: Secondary | ICD-10-CM | POA: Diagnosis not present

## 2020-05-17 DIAGNOSIS — Z0000 Encounter for general adult medical examination without abnormal findings: Secondary | ICD-10-CM | POA: Diagnosis not present

## 2020-06-05 ENCOUNTER — Ambulatory Visit (INDEPENDENT_AMBULATORY_CARE_PROVIDER_SITE_OTHER): Payer: Federal, State, Local not specified - PPO

## 2020-06-05 ENCOUNTER — Other Ambulatory Visit: Payer: Self-pay

## 2020-06-05 ENCOUNTER — Ambulatory Visit
Admission: EM | Admit: 2020-06-05 | Discharge: 2020-06-05 | Disposition: A | Discharge status: Home / Self Care | Payer: Federal, State, Local not specified - PPO | Attending: Emergency Medicine | Admitting: Emergency Medicine

## 2020-06-05 DIAGNOSIS — R609 Edema, unspecified: Secondary | ICD-10-CM | POA: Diagnosis not present

## 2020-06-05 DIAGNOSIS — S52615A Nondisplaced fracture of left ulna styloid process, initial encounter for closed fracture: Secondary | ICD-10-CM | POA: Diagnosis not present

## 2020-06-05 DIAGNOSIS — W19XXXA Unspecified fall, initial encounter: Secondary | ICD-10-CM

## 2020-06-05 DIAGNOSIS — M25472 Effusion, left ankle: Secondary | ICD-10-CM

## 2020-06-05 DIAGNOSIS — S52502A Unspecified fracture of the lower end of left radius, initial encounter for closed fracture: Secondary | ICD-10-CM

## 2020-06-05 DIAGNOSIS — M25872 Other specified joint disorders, left ankle and foot: Secondary | ICD-10-CM

## 2020-06-05 DIAGNOSIS — M7989 Other specified soft tissue disorders: Secondary | ICD-10-CM | POA: Diagnosis not present

## 2020-06-05 DIAGNOSIS — S93402A Sprain of unspecified ligament of left ankle, initial encounter: Secondary | ICD-10-CM | POA: Diagnosis not present

## 2020-06-05 NOTE — Discharge Instructions (Addendum)

## 2020-06-05 NOTE — ED Triage Notes (Signed)
Pt states she missed a step leaving the house for work and fell. Pt attempted to catch herself and has deformity and swelling to the left wrist. Pt has very limited/no ROM in the wrist area as well. Pt is not complaining of severe pain at this time.

## 2020-06-05 NOTE — ED Provider Notes (Signed)
[facility]    CSN: [id number] Arrival date & time: 06/05/20  0809      History   Chief Complaint Chief Complaint  Patient presents with  . Wrist Injury    occurred this am    HPI TALENA NEIRA is a 54 y.o. female  Presenting for left wrist pain, swelling as well as left ankle pain, swelling s/p fall.  Patient states this occurred this morning she was leaving for work.  Missed a step, inverted her ankle upon landing and fell onto outstretched hand while carrying bags.  Denies head trauma, LOC.  No numbness.  Denies anticoagulant/blood thinner use.   Past Medical History:  Diagnosis Date  . Major depression in remission (HCC)   . Scoliosis   . Varicose veins     Patient Active Problem List   Diagnosis Date Noted  . Varicose veins of leg with complications 05/13/2016  . Varicose veins of bilateral lower extremities with other complications 05/06/2016    Past Surgical History:  Procedure Laterality Date  . APPENDECTOMY    . BACK SURGERY  09/2015  . CERVICAL FUSION    . CESAREAN SECTION    . CHOLECYSTECTOMY    . PANNICULECTOMY      OB History   No obstetric history on file.      Home Medications    Prior to Admission medications   Medication Sig Start Date End Date Taking? Authorizing Provider  ALPRAZolam (XANAX) 0.25 MG tablet Take 0.25 mg by mouth 3 (three) times daily as needed.  01/15/16   [provider]  citalopram (CELEXA) 40 MG tablet Take 20 mg by mouth. 09/20/15   [provider]  estradiol (ESTRACE) 0.5 MG tablet Take 0.5 mg by mouth daily. 11/25/19   [provider]  MedroxyPROGESTERone Acetate 150 MG/ML SUSY  04/29/16   [provider]    Family History Family History  Problem Relation Age of Onset  . Diabetes Mother   . Heart disease Mother   . Hyperlipidemia Mother     Social History Social History   Tobacco Use  . Smoking status: Never Smoker  . Smokeless tobacco: Never Used  Vaping Use   . Vaping Use: Never used  Substance Use Topics  . Alcohol use: Yes    Comment: 2 glasses wine a month  . Drug use: No     Allergies   Patient has no known allergies.   Review of Systems As per HPI   Physical Exam Triage Vital Signs ED Triage Vitals  Enc Vitals Group     BP 06/05/20 0823 107/73     Pulse Rate 06/05/20 0823 86     Resp 06/05/20 0823 17     Temp 06/05/20 0823 98.1 F (36.7 C)     Temp Source 06/05/20 0823 Oral     SpO2 06/05/20 0823 96 %     Weight --      Height --      Head Circumference --      Peak Flow --      Pain Score 06/05/20 0824 7     Pain Loc --      Pain Edu? --      Excl. in GC? --    No data found.  Updated Vital Signs BP 107/73 (BP Location: Right Arm)   Pulse 86   Temp 98.1 F (36.7 C) (Oral)   Resp 17   SpO2 96%   Visual Acuity Right Eye Distance:  Left Eye Distance:   Bilateral Distance:    Right Eye Near:   Left Eye Near:    Bilateral Near:     Physical Exam Constitutional:      General: She is not in acute distress. HENT:     Head: Normocephalic and atraumatic.  Eyes:     General: No scleral icterus.    Pupils: Pupils are equal, round, and reactive to light.  Cardiovascular:     Rate and Rhythm: Normal rate.  Pulmonary:     Effort: Pulmonary effort is normal.  Musculoskeletal:     Comments: Left wrist: Obvious swelling to distal radial head.  No open wound.  Neurovascularly intact.  Patient tender over area of swelling.  Able to move all fingers, though does have decreased grip strength secondary pain.  Ipsilateral elbow unremarkable. Left ankle with lateral swelling and lateral malleoli tenderness.  No medial malleoli tenderness.  Able to bear weight with slight limp.  Neurovascularly intact.  Skin:    Coloration: Skin is not jaundiced or pale.  Neurological:     Mental Status: She is alert and oriented to person, place, and time.      UC Treatments / Results  Labs (all labs ordered are listed, but  only abnormal results are displayed) Labs Reviewed - No data to display  EKG   Radiology DG Wrist Complete Left  Result Date: 06/05/2020 CLINICAL DATA:  Left wrist swelling.  Recent fall. EXAM: LEFT WRIST - COMPLETE 3+ VIEW COMPARISON:  None. FINDINGS: Comminuted fracture of the distal radius with probable intra-articular involvement. Dorsal displacement of the distal radial fracture. Fracture involving the ulnar styloid at the base. Carpal bones are intact. Soft tissue swelling at the left wrist. Left wrist is located. IMPRESSION: 1. Comminuted and displaced fracture of the distal radius. 2. Fracture of the distal ulna at the base of the ulnar styloid. Electronically Signed   By: Richarda Overlie M.D.   On: 06/05/2020 09:03   DG Ankle Complete Left  Result Date: 06/05/2020 CLINICAL DATA:  Swelling and limited range of motion EXAM: LEFT ANKLE COMPLETE - 3+ VIEW COMPARISON:  None. FINDINGS: No acute fracture or dislocation. No aggressive osseous lesion. 5 mm well corticated osseous fragment along the medial aspect of the talar dome which may reflect a loose body. Severe soft tissue swelling along the medial and anterior aspect of the ankle joint. IMPRESSION: 1. No acute osseous injury of the left ankle. 2. Soft tissue swelling along the medial and anterior aspect of the ankle 3. A well corticated 5 mm osseous fragment along the medial aspect of the talar dome which may reflect a loose body. Electronically Signed   By: Elige Ko   On: 06/05/2020 09:10    Procedures Procedures (including critical care time)  Medications Ordered in UC Medications - No data to display  Initial Impression / Assessment and Plan / UC Course  I have reviewed the triage vital signs and the nursing notes.  Pertinent labs & imaging results that were available during my care of the patient were reviewed by me and considered in my medical decision making (see chart for details).     Left ankle x-ray negative.  Left  wrist x-ray significant for comminuted and displaced fracture of distal radius as well as fracture of distal ulna at the base of the ulnar styloid.  Consulted with Dr. Melvyn Novas of orthopedics who recommended follow-up next day in office.  Sugar tong splint applied by me: NVI pre and  post proc.  Patient electing OTC pain management.  Return precautions discussed, pt verbalized understanding and is agreeable to plan. Final Clinical Impressions(s) / UC Diagnoses   Final diagnoses:  Sprain of left ankle, unspecified ligament, initial encounter  Fall, initial encounter  Closed fracture of distal end of left radius, unspecified fracture morphology, initial encounter  Closed nondisplaced fracture of styloid process of left ulna, initial encounter     Discharge Instructions     RICE: rest, ice, compression, elevation as needed for pain.    Pain medication:  350 mg-1000 mg of Tylenol (acetaminophen) and/or 200 mg - 800 mg of Advil (ibuprofen, Motrin) every 8 hours as needed.  May alternate between the two throughout the day as they are generally safe to take together.  DO NOT exceed more than 3000 mg of Tylenol or 3200 mg of ibuprofen in a 24 hour period as this could damage your stomach, kidneys, liver, or increase your bleeding risk.  Important to follow up with specialist(s) below for further evaluation/management if your symptoms persist or worsen.    ED Prescriptions    None     I have reviewed the PDMP during this encounter.   Hall-Potvin, [place], [place] 06/05/20 1041

## 2020-06-06 ENCOUNTER — Encounter (HOSPITAL_BASED_OUTPATIENT_CLINIC_OR_DEPARTMENT_OTHER): Payer: Self-pay | Admitting: Orthopaedic Surgery

## 2020-06-06 ENCOUNTER — Other Ambulatory Visit (HOSPITAL_COMMUNITY)
Admission: RE | Admit: 2020-06-06 | Discharge: 2020-06-06 | Disposition: A | Discharge status: Home / Self Care | Payer: Federal, State, Local not specified - PPO | Source: Ambulatory Visit | Attending: Orthopaedic Surgery | Admitting: Orthopaedic Surgery

## 2020-06-06 ENCOUNTER — Other Ambulatory Visit: Payer: Self-pay

## 2020-06-06 DIAGNOSIS — Z01812 Encounter for preprocedural laboratory examination: Secondary | ICD-10-CM | POA: Diagnosis not present

## 2020-06-06 DIAGNOSIS — Z20822 Contact with and (suspected) exposure to covid-19: Secondary | ICD-10-CM | POA: Diagnosis not present

## 2020-06-06 DIAGNOSIS — M25572 Pain in left ankle and joints of left foot: Secondary | ICD-10-CM | POA: Diagnosis not present

## 2020-06-06 LAB — SARS CORONAVIRUS 2 (TAT 6-24 HRS): SARS Coronavirus 2: NEGATIVE

## 2020-06-06 NOTE — H&P (Signed)
PREOPERATIVE H&P  Chief Complaint: LEFT WRIST FRACTURE  HPI: Heather Davis is a 54 y.o. female who is scheduled for OPEN REDUCTION INTERNAL FIXATION (ORIF) WRIST FRACTURE.   Patient has a past medical history significant for depression, scoliosis,  Varicose veins.   Patient had an injury where she fell while leaving for work. She missed a step and landed on an outstretched hand while carrying bags. Se had pain in her left wrist and left ankle after the fall. She was seen at an urgent care following the injury on 06/05/2020. Xrays showed displaced distal radius fracture. She was told to follow-up with orthopedics.   Her symptoms are rated as moderate to severe, and have been worsening.  This is significantly impairing activities of daily living.    Please see clinic note for further details on this patient's care.    She has elected for surgical management.   Past Medical History:  Diagnosis Date  . Anxiety   . Complication of anesthesia   . Left wrist fracture   . Major depression in remission (HCC)   . PONV (postoperative nausea and vomiting)   . Scoliosis   . Sleep apnea    mouth guard nightly  . Varicose veins    Past Surgical History:  Procedure Laterality Date  . APPENDECTOMY    . BACK SURGERY  09/2015  . CERVICAL FUSION    . CESAREAN SECTION    . CHOLECYSTECTOMY    . PANNICULECTOMY     Social History   Socioeconomic History  . Marital status: Divorced    Spouse name: Not on file  . Number of children: 2  . Years of education: college  . Highest education level: Not on file  Occupational History  . Not on file  Tobacco Use  . Smoking status: Never Smoker  . Smokeless tobacco: Never Used  Vaping Use  . Vaping Use: Never used  Substance and Sexual Activity  . Alcohol use: Yes    Comment: 2 glasses wine a month  . Drug use: No  . Sexual activity: Not Currently    Birth control/protection: Post-menopausal  Other Topics Concern  . Not on file  Social  History Narrative   Drinks about 2 caffeine drinks a day    Social Determinants of Health   Financial Resource Strain:   . Difficulty of Paying Living Expenses: Not on file  Food Insecurity:   . Worried About Programme researcher, broadcasting/film/video in the Last Year: Not on file  . Ran Out of Food in the Last Year: Not on file  Transportation Needs:   . Lack of Transportation (Medical): Not on file  . Lack of Transportation (Non-Medical): Not on file  Physical Activity:   . Days of Exercise per Week: Not on file  . Minutes of Exercise per Session: Not on file  Stress:   . Feeling of Stress : Not on file  Social Connections:   . Frequency of Communication with Friends and Family: Not on file  . Frequency of Social Gatherings with Friends and Family: Not on file  . Attends Religious Services: Not on file  . Active Member of Clubs or Organizations: Not on file  . Attends Banker Meetings: Not on file  . Marital Status: Not on file   Family History  Problem Relation Age of Onset  . Diabetes Mother   . Heart disease Mother   . Hyperlipidemia Mother    No Known Allergies Prior to  Admission medications   Medication Sig Start Date End Date Taking? Authorizing Provider  citalopram (CELEXA) 40 MG tablet Take 40 mg by mouth.  09/20/15  Yes [provider]  estradiol (ESTRACE) 0.5 MG tablet Take 0.5 mg by mouth daily. 11/25/19  Yes [provider]  medroxyPROGESTERone (PROVERA) 5 MG tablet Take 5 mg by mouth daily.   Yes [provider]  ALPRAZolam (XANAX) 0.25 MG tablet Take 0.25 mg by mouth 3 (three) times daily as needed.  01/15/16   [provider]    ROS: All other systems have been reviewed and were otherwise negative with the exception of those mentioned in the HPI and as above.  Physical Exam: General: Alert, no acute distress Cardiovascular: No pedal edema Respiratory: No cyanosis, no use of accessory musculature GI: No organomegaly, abdomen is soft  and non-tender Skin: No lesions in the area of chief complaint Neurologic: Sensation intact distally Psychiatric: Patient is competent for consent with normal mood and affect Lymphatic: No axillary or cervical lymphadenopathy  MUSCULOSKELETAL:  Left upper extremity: Splint CDI. Skin intact though cannot assess fully beneath splint. Nontender to palpation proximally. Distal motor and sensory function intact. Well perfused digits.    Imaging: Xrays left wrist showing displaced distal radius fracture  Assessment: LEFT WRIST FRACTURE  Plan: Plan for Procedure(s): OPEN REDUCTION INTERNAL FIXATION (ORIF) WRIST FRACTURE  The risks benefits and alternatives were discussed with the patient including but not limited to the risks of nonoperative treatment, versus surgical intervention including infection, bleeding, nerve injury,  blood clots, cardiopulmonary complications, morbidity, mortality, among others, and they were willing to proceed.   The patient acknowledged the explanation, agreed to proceed with the plan and consent was signed.   Operative Plan: ORIF left wrist fracture Discharge Medications: Standard DVT Prophylaxis: None Physical Therapy: +/- outpatient PT Special Discharge needs: Splint. Sling for comfort.    Vernetta Honey, PA-C  06/06/2020 5:24 PM

## 2020-06-08 ENCOUNTER — Encounter (HOSPITAL_BASED_OUTPATIENT_CLINIC_OR_DEPARTMENT_OTHER)
Admission: RE | Disposition: A | Discharge status: Home / Self Care | Payer: Self-pay | Source: Home / Self Care | Attending: Orthopaedic Surgery

## 2020-06-08 ENCOUNTER — Other Ambulatory Visit: Payer: Self-pay

## 2020-06-08 ENCOUNTER — Ambulatory Visit (HOSPITAL_BASED_OUTPATIENT_CLINIC_OR_DEPARTMENT_OTHER): Payer: Federal, State, Local not specified - PPO | Admitting: Anesthesiology

## 2020-06-08 ENCOUNTER — Ambulatory Visit (HOSPITAL_BASED_OUTPATIENT_CLINIC_OR_DEPARTMENT_OTHER)
Admission: RE | Admit: 2020-06-08 | Discharge: 2020-06-08 | Disposition: A | Discharge status: Home / Self Care | Payer: Federal, State, Local not specified - PPO | Attending: Orthopaedic Surgery | Admitting: Orthopaedic Surgery

## 2020-06-08 ENCOUNTER — Encounter (HOSPITAL_BASED_OUTPATIENT_CLINIC_OR_DEPARTMENT_OTHER): Payer: Self-pay | Admitting: Orthopaedic Surgery

## 2020-06-08 DIAGNOSIS — G4730 Sleep apnea, unspecified: Secondary | ICD-10-CM | POA: Diagnosis not present

## 2020-06-08 DIAGNOSIS — Z793 Long term (current) use of hormonal contraceptives: Secondary | ICD-10-CM | POA: Diagnosis not present

## 2020-06-08 DIAGNOSIS — F418 Other specified anxiety disorders: Secondary | ICD-10-CM | POA: Diagnosis not present

## 2020-06-08 DIAGNOSIS — F329 Major depressive disorder, single episode, unspecified: Secondary | ICD-10-CM | POA: Diagnosis not present

## 2020-06-08 DIAGNOSIS — S52502A Unspecified fracture of the lower end of left radius, initial encounter for closed fracture: Secondary | ICD-10-CM | POA: Diagnosis not present

## 2020-06-08 DIAGNOSIS — S52572K Other intraarticular fracture of lower end of left radius, subsequent encounter for closed fracture with nonunion: Secondary | ICD-10-CM | POA: Insufficient documentation

## 2020-06-08 DIAGNOSIS — Z79899 Other long term (current) drug therapy: Secondary | ICD-10-CM | POA: Diagnosis not present

## 2020-06-08 DIAGNOSIS — W109XXA Fall (on) (from) unspecified stairs and steps, initial encounter: Secondary | ICD-10-CM | POA: Diagnosis not present

## 2020-06-08 DIAGNOSIS — S52572A Other intraarticular fracture of lower end of left radius, initial encounter for closed fracture: Secondary | ICD-10-CM | POA: Diagnosis not present

## 2020-06-08 DIAGNOSIS — I83893 Varicose veins of bilateral lower extremities with other complications: Secondary | ICD-10-CM | POA: Diagnosis not present

## 2020-06-08 DIAGNOSIS — M419 Scoliosis, unspecified: Secondary | ICD-10-CM | POA: Insufficient documentation

## 2020-06-08 HISTORY — DX: Fracture of unspecified carpal bone, left wrist, initial encounter for closed fracture: S62.102A

## 2020-06-08 HISTORY — DX: Other specified postprocedural states: R11.2

## 2020-06-08 HISTORY — PX: ORIF WRIST FRACTURE: SHX2133

## 2020-06-08 HISTORY — DX: Other complications of anesthesia, initial encounter: T88.59XA

## 2020-06-08 HISTORY — DX: Other specified postprocedural states: Z98.890

## 2020-06-08 HISTORY — DX: Anxiety disorder, unspecified: F41.9

## 2020-06-08 HISTORY — DX: Sleep apnea, unspecified: G47.30

## 2020-06-08 SURGERY — OPEN REDUCTION INTERNAL FIXATION (ORIF) WRIST FRACTURE
Anesthesia: Regional | Site: Wrist | Laterality: Left

## 2020-06-08 MED ORDER — ONDANSETRON HCL 4 MG/2ML IJ SOLN
INTRAMUSCULAR | Status: DC | PRN
  Administered 2020-06-08: 4 mg via INTRAVENOUS

## 2020-06-08 MED ORDER — CEFAZOLIN SODIUM-DEXTROSE 2-4 GM/100ML-% IV SOLN
INTRAVENOUS | Status: AC
  Filled 2020-06-08: qty 100

## 2020-06-08 MED ORDER — DEXAMETHASONE SODIUM PHOSPHATE 4 MG/ML IJ SOLN
INTRAMUSCULAR | Status: DC | PRN
  Administered 2020-06-08: 6 mg via PERINEURAL

## 2020-06-08 MED ORDER — BUPIVACAINE-EPINEPHRINE (PF) 0.5% -1:200000 IJ SOLN
INTRAMUSCULAR | Status: DC | PRN
  Administered 2020-06-08: 40 mL via PERINEURAL

## 2020-06-08 MED ORDER — FENTANYL CITRATE (PF) 100 MCG/2ML IJ SOLN
100.0000 ug | Freq: Once | INTRAMUSCULAR | Status: AC
  Administered 2020-06-08: 100 ug via INTRAVENOUS

## 2020-06-08 MED ORDER — VANCOMYCIN HCL 1000 MG IV SOLR
INTRAVENOUS | Status: AC
  Filled 2020-06-08: qty 2000

## 2020-06-08 MED ORDER — MIDAZOLAM HCL 2 MG/2ML IJ SOLN
2.0000 mg | Freq: Once | INTRAMUSCULAR | Status: AC
  Administered 2020-06-08: 2 mg via INTRAVENOUS

## 2020-06-08 MED ORDER — CLONIDINE HCL (ANALGESIA) 100 MCG/ML EP SOLN
EPIDURAL | Status: DC | PRN
  Administered 2020-06-08: 100 ug

## 2020-06-08 MED ORDER — FENTANYL CITRATE (PF) 100 MCG/2ML IJ SOLN
INTRAMUSCULAR | Status: AC
  Filled 2020-06-08: qty 2

## 2020-06-08 MED ORDER — OXYCODONE HCL 5 MG PO TABS
ORAL_TABLET | ORAL | 0 refills | Status: AC
Start: 2020-06-08 — End: 2020-06-13

## 2020-06-08 MED ORDER — PROPOFOL 500 MG/50ML IV EMUL
INTRAVENOUS | Status: DC | PRN
  Administered 2020-06-08: 75 ug/kg/min via INTRAVENOUS

## 2020-06-08 MED ORDER — ONDANSETRON HCL 4 MG/2ML IJ SOLN
INTRAMUSCULAR | Status: AC
  Filled 2020-06-08: qty 2

## 2020-06-08 MED ORDER — LACTATED RINGERS IV SOLN: INTRAVENOUS | Status: DC

## 2020-06-08 MED ORDER — MELOXICAM 7.5 MG PO TABS: 7.5000 mg | ORAL_TABLET | Freq: Every day | ORAL | 0 refills | Status: AC

## 2020-06-08 MED ORDER — ONDANSETRON HCL 4 MG PO TABS: 4.0000 mg | ORAL_TABLET | Freq: Three times a day (TID) | ORAL | 1 refills | Status: AC | PRN

## 2020-06-08 MED ORDER — CEFAZOLIN SODIUM-DEXTROSE 2-4 GM/100ML-% IV SOLN
2.0000 g | INTRAVENOUS | Status: AC
  Administered 2020-06-08: 2 g via INTRAVENOUS

## 2020-06-08 MED ORDER — PROPOFOL 500 MG/50ML IV EMUL
INTRAVENOUS | Status: AC
  Filled 2020-06-08: qty 50

## 2020-06-08 MED ORDER — LIDOCAINE 2% (20 MG/ML) 5 ML SYRINGE
INTRAMUSCULAR | Status: AC
  Filled 2020-06-08: qty 5

## 2020-06-08 MED ORDER — ACETAMINOPHEN 500 MG PO TABS: 1000.0000 mg | ORAL_TABLET | Freq: Three times a day (TID) | ORAL | 0 refills | Status: AC

## 2020-06-08 MED ORDER — PROPOFOL 10 MG/ML IV BOLUS
INTRAVENOUS | Status: DC | PRN
  Administered 2020-06-08: 20 mg via INTRAVENOUS
  Administered 2020-06-08: 30 mg via INTRAVENOUS

## 2020-06-08 MED ORDER — MIDAZOLAM HCL 2 MG/2ML IJ SOLN
INTRAMUSCULAR | Status: AC
  Filled 2020-06-08: qty 2

## 2020-06-08 MED ORDER — PROPOFOL 10 MG/ML IV BOLUS
INTRAVENOUS | Status: AC
  Filled 2020-06-08: qty 20

## 2020-06-08 MED ORDER — DEXAMETHASONE SODIUM PHOSPHATE 10 MG/ML IJ SOLN
INTRAMUSCULAR | Status: AC
  Filled 2020-06-08: qty 1

## 2020-06-08 MED ORDER — VANCOMYCIN HCL 1000 MG IV SOLR
INTRAVENOUS | Status: DC | PRN
  Administered 2020-06-08: 1000 mg

## 2020-06-08 SURGICAL SUPPLY — 67 items
BIT DRILL 1.7 (BIT) ×1 IMPLANT
BIT DRILL 2.5 CANN REUSE (DRILL) ×1 IMPLANT
BLADE HEX COATED 2.75 (ELECTRODE) ×2 IMPLANT
BLADE SURG 15 STRL LF DISP TIS (BLADE) ×1 IMPLANT
BLADE SURG 15 STRL SS (BLADE) ×2
BNDG CMPR 9X4 STRL LF SNTH (GAUZE/BANDAGES/DRESSINGS) ×1
BNDG COHESIVE 3X5 TAN STRL LF (GAUZE/BANDAGES/DRESSINGS) ×2 IMPLANT
BNDG ELASTIC 3X5.8 VLCR STR LF (GAUZE/BANDAGES/DRESSINGS) ×2 IMPLANT
BNDG ELASTIC 4X5.8 VLCR STR LF (GAUZE/BANDAGES/DRESSINGS) ×3 IMPLANT
BNDG ESMARK 4X9 LF (GAUZE/BANDAGES/DRESSINGS) ×2 IMPLANT
BRUSH SCRUB EZ PLAIN DRY (MISCELLANEOUS) ×2 IMPLANT
CANISTER SUCT 1200ML W/VALVE (MISCELLANEOUS) ×1 IMPLANT
CLSR STERI-STRIP ANTIMIC 1/2X4 (GAUZE/BANDAGES/DRESSINGS) ×2 IMPLANT
CORTEX SCREW 2.4X22MM VAL NEAR (Screw) ×2 IMPLANT
COVER BACK TABLE 60X90IN (DRAPES) ×2 IMPLANT
COVER WAND RF STERILE (DRAPES) IMPLANT
CUFF TOURN SGL QUICK 18X4 (TOURNIQUET CUFF) ×2 IMPLANT
DECANTER SPIKE VIAL GLASS SM (MISCELLANEOUS) IMPLANT
DRAPE EXTREMITY T 121X128X90 (DISPOSABLE) ×2 IMPLANT
DRAPE IMP U-DRAPE 54X76 (DRAPES) ×2 IMPLANT
DRAPE OEC MINIVIEW 54X84 (DRAPES) ×2 IMPLANT
DRAPE SURG 17X23 STRL (DRAPES) ×2 IMPLANT
ELECT REM PT RETURN 9FT ADLT (ELECTROSURGICAL) ×2
ELECTRODE REM PT RTRN 9FT ADLT (ELECTROSURGICAL) ×1 IMPLANT
GAUZE SPONGE 4X4 12PLY STRL (GAUZE/BANDAGES/DRESSINGS) ×2 IMPLANT
GLOVE BIO SURGEON STRL SZ 6.5 (GLOVE) ×3 IMPLANT
GLOVE BIOGEL PI IND STRL 6.5 (GLOVE) ×1 IMPLANT
GLOVE BIOGEL PI IND STRL 8 (GLOVE) ×1 IMPLANT
GLOVE BIOGEL PI INDICATOR 6.5 (GLOVE) ×1
GLOVE BIOGEL PI INDICATOR 8 (GLOVE) ×1
GLOVE ECLIPSE 8.0 STRL XLNG CF (GLOVE) ×2 IMPLANT
GOWN STRL REUS W/ TWL LRG LVL3 (GOWN DISPOSABLE) ×1 IMPLANT
GOWN STRL REUS W/ TWL XL LVL3 (GOWN DISPOSABLE) IMPLANT
GOWN STRL REUS W/TWL LRG LVL3 (GOWN DISPOSABLE) ×2
GOWN STRL REUS W/TWL XL LVL3 (GOWN DISPOSABLE) ×4 IMPLANT
GUIDEWIRE 1.35MM (WIRE) ×2 IMPLANT
NDL HYPO 25X1 1.5 SAFETY (NEEDLE) ×1 IMPLANT
NEEDLE HYPO 25X1 1.5 SAFETY (NEEDLE) ×2 IMPLANT
NS IRRIG 1000ML POUR BTL (IV SOLUTION) ×1 IMPLANT
PACK BASIN DAY SURGERY FS (CUSTOM PROCEDURE TRAY) ×2 IMPLANT
PAD CAST 4YDX4 CTTN HI CHSV (CAST SUPPLIES) ×1 IMPLANT
PADDING CAST COTTON 4X4 STRL (CAST SUPPLIES) ×2
PENCIL SMOKE EVACUATOR (MISCELLANEOUS) ×2 IMPLANT
PLATE DIST VOLAR NARROW 3H LT (Plate) ×1 IMPLANT
SCREW CORTEX 2.4X22MM VAL NEAR (Screw) IMPLANT
SCREW CORTEX VAL NEAR 2.4X18MM (Screw) ×1 IMPLANT
SCREW CORTEX VAL NEAR 2.4X20MM (Screw) ×3 IMPLANT
SCREW LO-PRO TI 3.5X16MM (Screw) ×1 IMPLANT
SCREW LOCKING HEX 3.5X16 (Screw) ×1 IMPLANT
SCREW NONLOCKING 2.4X24MM (Screw) ×1 IMPLANT
SLEEVE DRILL 1.7 GUIDE (IRRIGATION / IRRIGATOR) ×6 IMPLANT
SLEEVE SCD COMPRESS KNEE MED (MISCELLANEOUS) ×2 IMPLANT
SLING ARM FOAM STRAP LRG (SOFTGOODS) ×1 IMPLANT
SPLINT PLASTER CAST XFAST 3X15 (CAST SUPPLIES) ×10 IMPLANT
SPLINT PLASTER XTRA FASTSET 3X (CAST SUPPLIES) ×10
SPONGE LAP 4X18 RFD (DISPOSABLE) IMPLANT
SUCTION FRAZIER HANDLE 10FR (MISCELLANEOUS) ×2
SUCTION TUBE FRAZIER 10FR DISP (MISCELLANEOUS) IMPLANT
SUT MNCRL AB 4-0 PS2 18 (SUTURE) ×2 IMPLANT
SUT VIC AB 3-0 SH 27 (SUTURE) ×2
SUT VIC AB 3-0 SH 27X BRD (SUTURE) IMPLANT
SYR BULB EAR ULCER 3OZ GRN STR (SYRINGE) ×2 IMPLANT
SYR CONTROL 10ML LL (SYRINGE) ×2 IMPLANT
TOWEL GREEN STERILE FF (TOWEL DISPOSABLE) ×3 IMPLANT
TRAY DSU PREP LF (CUSTOM PROCEDURE TRAY) ×2 IMPLANT
TUBE CONNECTING 20X1/4 (TUBING) ×2 IMPLANT
YANKAUER SUCT BULB TIP NO VENT (SUCTIONS) IMPLANT

## 2020-06-08 NOTE — Anesthesia Procedure Notes (Signed)
Anesthesia Regional Block: Axillary brachial plexus block   Pre-Anesthetic Checklist: ,, timeout performed, Correct Patient, Correct Site, Correct Laterality, Correct Procedure, Correct Position, site marked, Risks and benefits discussed,  Surgical consent,  Pre-op evaluation,  At surgeon's request and post-op pain management  Laterality: Left  Prep: chloraprep       Needles:  Injection technique: Single-shot  Needle Type: Stimulator Needle - 40     Needle Length: 4cm  Needle Gauge: 22     Additional Needles:   Procedures:,,,, ultrasound used (permanent image in chart),,,,  Narrative:  Start time: 06/08/2020 10:27 AM End time: 06/08/2020 10:32 AM Injection made incrementally with aspirations every 5 mL. Anesthesiologist: Lewie Loron, MD  Additional Notes: BP cuff, EKG monitors applied. Sedation begun. Nerve location verified with U/S. Anesthetic injected incrementally, slowly , and after neg aspirations under direct u/s guidance. Good perineural spread. Tolerated well.

## 2020-06-08 NOTE — Progress Notes (Signed)
Assisted Dr. Germeroth with left, ultrasound guided, axillary block. Side rails up, monitors on throughout procedure. See vital signs in flow sheet. Tolerated Procedure well. 

## 2020-06-08 NOTE — Op Note (Signed)
Orthopaedic Surgery Operative Note (CSN: [id number])  Heather Davis  09-Mar-1966 Date of Surgery: 06/08/2020   Diagnoses:  Left distal radius intra-articular fracture  Procedure: Left wrist greater than 3 part intra-articular fracture open reduction internal fixation   Operative Finding Successful completion of the planned procedure.  Patient's bone quality was good and we had a robust plate fixation.  There was a small crack that went into the ulnar side of the intra-articular space but was anatomically reduced.  Post-operative plan: The patient will be nonweightbearing in a Demi splint for a week and transition to a removable wrist splint then.  The patient will be discharged home.  DVT prophylaxis not indicated in this ambulatory upper extremity patient without significant risk factors.   Pain control with PRN pain medication preferring oral medicines.  Follow up plan will be scheduled in approximately 7 days for incision check and XR.  Post-Op Diagnosis: Same Surgeons:Primary: Bjorn Pippin, MD Assistants:Caroline McBane PA-C Location: [facility] OR ROOM 6 Anesthesia: Sedation plus regional anesthesia Antibiotics: Ancef 2 g with local vancomycin powder 1 g at the surgical site Tourniquet time: 12 minutes Estimated Blood Loss: Minimal Complications: None Specimens: None Implants: Implant Name Type Inv. Item Serial No. Manufacturer Lot No. LRB No. Used Action  SCREW CORTEX VAL NEAR 2.4X20MM - [id number] Screw SCREW CORTEX VAL NEAR 2.4X20MM  [employer]  Left 3 Implanted  SCREW CORTEX VAL NEAR 2.4X18MM - [id number] Screw SCREW CORTEX VAL NEAR 2.4X18MM  [employer]  Left 1 Implanted  PLATE VOLAR DIST LT 3H - [id number] Plate PLATE VOLAR DIST LT 3H  [employer]  Left 1 Implanted  CORTEX SCREW 2.4X22MM VAL NEAR - [id number] Screw CORTEX SCREW 2.4X22MM VAL NEAR  [employer]  Left 1 Implanted  SCREW LO-PRO TI 3.5X16MM - [id number] Screw SCREW LO-PRO TI 3.5X16MM  [employer]  Left 1 Implanted  SCREW  LOCKING HEX 3.5X16 - [id number] Screw SCREW LOCKING HEX 3.5X16  [employer]  Left 1 Implanted  2.4 x 80mm screw, [id number]    [employer]  Left 1 Implanted    Indications for Surgery:   Heather Davis is a 54 y.o. female with fall resulting in a displaced distal radius fracture.  Benefits and risks of operative and nonoperative management were discussed prior to surgery with patient/guardian(s) and informed consent form was completed.  Specific risks including infection, need for additional surgery, nonunion, CRPS, neurovascular injury and stiffness amongst others   Procedure:   The patient was identified properly. Informed consent was obtained and the surgical site was marked. The patient was taken up to suite where general anesthesia was induced.  The patient was positioned supine on a hand table.  The left wrist was prepped and draped in the usual sterile fashion.  Timeout was performed before the beginning of the case.  Tourniquet was used for the above duration.  An FCR approach was made exposing the volar surface of the distal radius taking care to go through the sheath of the FCR tendon tract and ulnarly exposing the inferior portion of the sheath while protecting the median nerve and radial artery on each side with blunt retractors.  This inferior portion of the sheath was incised sharply and examined for presence of the palmar cutaneous branch of the median nerve.  It was determined to not be within the field and we carried our dissection deeply to the bone splitting the pronator quadratus and exposing the fracture site.    Appropriate reduction was obtained and  a narrow Arthrex distal radius plate was placed and checked for sizing and reduction under fluoroscopy.  This reduction was held in place with K wires and a K wire was placed into the radial styloid.    Once appropriate reduction was confirmed we then proceeded to fix the plate proximally and then proceeded to fill the distal holes  with a combination of partially threaded screws and pegs.  At this point we checked our reduction to ensure that there was no intra-articular extension of our screws.  Once this was confirmed we proceeded to fill remaining 2 proximal shaft screws and obtained final images which demonstrated appropriate reduction and maintenance of alignment.  The DRUJ was checked and found to be stable.  We verified that all fast guides were removed on XR and through count.    The wound was thoroughly irrigated.  The tourniquet was released prior to skin closure to verify there was no excessive bleeding and we visualized that the radial artery and median nerve were intact at the end of the case. The PQ was reapproximated grossly prior to skin closure.     We irrigated the wound copiously before placing local antibiotic as listed above.  We closed the incision in a multilayer fashion with absorbable suture.  Sterile dressing was placed.  Small splint was placed.  Patient was awoken taken to PACU in stable condition.  Alfonse Alpers, PA-C, present and scrubbed throughout the case, critical for completion in a timely fashion, and for retraction, instrumentation, closure.

## 2020-06-08 NOTE — Discharge Instructions (Signed)
  Post Anesthesia Home Care Instructions  Activity: Get plenty of rest for the remainder of the day. A responsible individual must stay with you for 24 hours following the procedure.  For the next 24 hours, DO NOT: -Drive a car -Operate machinery -Drink alcoholic beverages -Take any medication unless instructed by your physician -Make any legal decisions or sign important papers.  Meals: Start with liquid foods such as gelatin or soup. Progress to regular foods as tolerated. Avoid greasy, spicy, heavy foods. If nausea and/or vomiting occur, drink only clear liquids until the nausea and/or vomiting subsides. Call your physician if vomiting continues.  Special Instructions/Symptoms: Your throat may feel dry or sore from the anesthesia or the breathing tube placed in your throat during surgery. If this causes discomfort, gargle with warm salt water. The discomfort should disappear within 24 hours.     Regional Anesthesia Blocks  1. Numbness or the inability to move the "blocked" extremity may last from 3-48 hours after placement. The length of time depends on the medication injected and your individual response to the medication. If the numbness is not going away after 48 hours, call your surgeon.  2. The extremity that is blocked will need to be protected until the numbness is gone and the  Strength has returned. Because you cannot feel it, you will need to take extra care to avoid injury. Because it may be weak, you may have difficulty moving it or using it. You may not know what position it is in without looking at it while the block is in effect.  3. For blocks in the legs and feet, returning to weight bearing and walking needs to be done carefully. You will need to wait until the numbness is entirely gone and the strength has returned. You should be able to move your leg and foot normally before you try and bear weight or walk. You will need someone to be with you when you first try to  ensure you do not fall and possibly risk injury.  4. Bruising and tenderness at the needle site are common side effects and will resolve in a few days.  5. Persistent numbness or new problems with movement should be communicated to the surgeon or the [facility] ([phone])/ [facility] ([phone]). 

## 2020-06-08 NOTE — Anesthesia Preprocedure Evaluation (Addendum)
Anesthesia Evaluation  Patient identified by MRN, date of birth, ID band Patient awake    Reviewed: Allergy & Precautions, NPO status , Patient's Chart, lab work & pertinent test results  History of Anesthesia Complications (+) PONV and history of anesthetic complications  Airway Mallampati: II  TM Distance: >3 FB Neck ROM: Full    Dental  (+) Dental Advisory Given, Teeth Intact   Pulmonary sleep apnea ,    Pulmonary exam normal breath sounds clear to auscultation       Cardiovascular negative cardio ROS Normal cardiovascular exam Rhythm:Regular Rate:Normal     Neuro/Psych PSYCHIATRIC DISORDERS Anxiety Depression negative neurological ROS     GI/Hepatic negative GI ROS, Neg liver ROS,   Endo/Other  negative endocrine ROS  Renal/GU negative Renal ROS     Musculoskeletal negative musculoskeletal ROS (+)   Abdominal   Peds  Hematology negative hematology ROS (+)   Anesthesia Other Findings   Reproductive/Obstetrics negative OB ROS                            Anesthesia Physical Anesthesia Plan  ASA: II  Anesthesia Plan: Regional   Post-op Pain Management:    Induction:   PONV Risk Score and Plan: 3 and Ondansetron, Dexamethasone, Propofol infusion, TIVA, Treatment may vary due to age or medical condition and Midazolam  Airway Management Planned: Natural Airway  Additional Equipment: None  Intra-op Plan:   Post-operative Plan:   Informed Consent: I have reviewed the patients History and Physical, chart, labs and discussed the procedure including the risks, benefits and alternatives for the proposed anesthesia with the patient or authorized representative who has indicated his/her understanding and acceptance.     Dental advisory given  Plan Discussed with: CRNA  Anesthesia Plan Comments:       Anesthesia Quick Evaluation

## 2020-06-08 NOTE — Anesthesia Postprocedure Evaluation (Signed)
Anesthesia Post Note  Patient: Heather Davis  Procedure(s) Performed: OPEN REDUCTION INTERNAL FIXATION (ORIF) WRIST FRACTURE (Left Wrist)     Patient location during evaluation: PACU Anesthesia Type: Regional Level of consciousness: awake and alert Pain management: pain level controlled Vital Signs Assessment: post-procedure vital signs reviewed and stable Respiratory status: spontaneous breathing Cardiovascular status: stable Anesthetic complications: no   No complications documented.  Last Vitals:  Vitals:   06/08/20 1312 06/08/20 1400  BP: 108/70 124/75  Pulse: 77 89  Resp: 13 16  Temp:  36.7 C  SpO2: 95% 96%    Last Pain:  Vitals:   06/08/20 1400  TempSrc:   PainSc: 0-No pain                 Lewie Loron

## 2020-06-08 NOTE — Transfer of Care (Signed)
Immediate Anesthesia Transfer of Care Note  Patient: Ezra Sites  Procedure(s) Performed: OPEN REDUCTION INTERNAL FIXATION (ORIF) WRIST FRACTURE (Left Wrist)  Patient Location: PACU  Anesthesia Type:MAC and Regional  Level of Consciousness: awake, alert  and oriented  Airway & Oxygen Therapy: Patient Spontanous Breathing and Patient connected to face mask oxygen  Post-op Assessment: Report given to RN and Post -op Vital signs reviewed and stable  Post vital signs: Reviewed and stable  Last Vitals:  Vitals Value Taken Time  BP    Temp    Pulse 82 06/08/20 1252  Resp    SpO2 94 % 06/08/20 1252  Vitals shown include unvalidated device data.  Last Pain:  Vitals:   06/08/20 1005  TempSrc: Oral  PainSc: 3       Patients Stated Pain Goal: 5 (29/19/16 6060)  Complications: No complications documented.

## 2020-06-08 NOTE — Anesthesia Procedure Notes (Signed)
Date/Time: 06/08/2020 12:01 PM Performed by: Thornell Mule, CRNA Oxygen Delivery Method: Simple face mask

## 2020-06-08 NOTE — Interval H&P Note (Signed)
History and Physical Interval Note:  06/08/2020 11:06 AM  Heather Davis  has presented today for surgery, with the diagnosis of LEFT WRIST FRACTURE.  The various methods of treatment have been discussed with the patient and family. After consideration of risks, benefits and other options for treatment, the patient has consented to  Procedure(s): OPEN REDUCTION INTERNAL FIXATION (ORIF) WRIST FRACTURE (Left) as a surgical intervention.  The patient's history has been reviewed, patient examined, no change in status, stable for surgery.  I have reviewed the patient's chart and labs.  Questions were answered to the patient's satisfaction.     Bjorn Pippin

## 2020-06-09 ENCOUNTER — Encounter (HOSPITAL_BASED_OUTPATIENT_CLINIC_OR_DEPARTMENT_OTHER): Payer: Self-pay | Admitting: Orthopaedic Surgery

## 2020-06-16 ENCOUNTER — Other Ambulatory Visit (HOSPITAL_COMMUNITY): Payer: Self-pay | Admitting: Orthopaedic Surgery

## 2020-06-16 ENCOUNTER — Ambulatory Visit (HOSPITAL_COMMUNITY)
Admission: RE | Admit: 2020-06-16 | Discharge: 2020-06-16 | Disposition: A | Discharge status: Home / Self Care | Payer: Federal, State, Local not specified - PPO | Source: Ambulatory Visit | Attending: Orthopaedic Surgery | Admitting: Orthopaedic Surgery

## 2020-06-16 ENCOUNTER — Other Ambulatory Visit: Payer: Self-pay

## 2020-06-16 DIAGNOSIS — R600 Localized edema: Secondary | ICD-10-CM | POA: Diagnosis not present

## 2020-06-16 DIAGNOSIS — M79604 Pain in right leg: Secondary | ICD-10-CM | POA: Insufficient documentation

## 2020-06-16 DIAGNOSIS — S52502D Unspecified fracture of the lower end of left radius, subsequent encounter for closed fracture with routine healing: Secondary | ICD-10-CM | POA: Diagnosis not present

## 2020-06-20 DIAGNOSIS — Z6827 Body mass index (BMI) 27.0-27.9, adult: Secondary | ICD-10-CM | POA: Diagnosis not present

## 2020-06-20 DIAGNOSIS — G894 Chronic pain syndrome: Secondary | ICD-10-CM | POA: Diagnosis not present

## 2020-06-20 DIAGNOSIS — M791 Myalgia, unspecified site: Secondary | ICD-10-CM | POA: Diagnosis not present

## 2020-06-20 DIAGNOSIS — M4325 Fusion of spine, thoracolumbar region: Secondary | ICD-10-CM | POA: Diagnosis not present

## 2020-06-26 DIAGNOSIS — S52502D Unspecified fracture of the lower end of left radius, subsequent encounter for closed fracture with routine healing: Secondary | ICD-10-CM | POA: Diagnosis not present

## 2020-06-26 DIAGNOSIS — M25532 Pain in left wrist: Secondary | ICD-10-CM | POA: Diagnosis not present

## 2020-06-26 DIAGNOSIS — R29898 Other symptoms and signs involving the musculoskeletal system: Secondary | ICD-10-CM | POA: Diagnosis not present

## 2020-06-26 DIAGNOSIS — M25632 Stiffness of left wrist, not elsewhere classified: Secondary | ICD-10-CM | POA: Diagnosis not present

## 2020-06-30 DIAGNOSIS — M25561 Pain in right knee: Secondary | ICD-10-CM | POA: Diagnosis not present

## 2020-07-05 DIAGNOSIS — R29898 Other symptoms and signs involving the musculoskeletal system: Secondary | ICD-10-CM | POA: Diagnosis not present

## 2020-07-05 DIAGNOSIS — M25532 Pain in left wrist: Secondary | ICD-10-CM | POA: Diagnosis not present

## 2020-07-05 DIAGNOSIS — M25632 Stiffness of left wrist, not elsewhere classified: Secondary | ICD-10-CM | POA: Diagnosis not present

## 2020-07-05 DIAGNOSIS — S52502D Unspecified fracture of the lower end of left radius, subsequent encounter for closed fracture with routine healing: Secondary | ICD-10-CM | POA: Diagnosis not present

## 2020-07-12 DIAGNOSIS — S42302A Unspecified fracture of shaft of humerus, left arm, initial encounter for closed fracture: Secondary | ICD-10-CM | POA: Diagnosis not present

## 2020-07-12 DIAGNOSIS — Z0000 Encounter for general adult medical examination without abnormal findings: Secondary | ICD-10-CM | POA: Diagnosis not present

## 2020-07-12 DIAGNOSIS — Z23 Encounter for immunization: Secondary | ICD-10-CM | POA: Diagnosis not present

## 2020-07-20 DIAGNOSIS — R29898 Other symptoms and signs involving the musculoskeletal system: Secondary | ICD-10-CM | POA: Diagnosis not present

## 2020-07-20 DIAGNOSIS — M25532 Pain in left wrist: Secondary | ICD-10-CM | POA: Diagnosis not present

## 2020-07-20 DIAGNOSIS — S52502D Unspecified fracture of the lower end of left radius, subsequent encounter for closed fracture with routine healing: Secondary | ICD-10-CM | POA: Diagnosis not present

## 2020-07-20 DIAGNOSIS — M25632 Stiffness of left wrist, not elsewhere classified: Secondary | ICD-10-CM | POA: Diagnosis not present

## 2020-07-21 DIAGNOSIS — M79661 Pain in right lower leg: Secondary | ICD-10-CM | POA: Diagnosis not present

## 2020-07-21 DIAGNOSIS — M25532 Pain in left wrist: Secondary | ICD-10-CM | POA: Diagnosis not present

## 2020-07-27 DIAGNOSIS — M25532 Pain in left wrist: Secondary | ICD-10-CM | POA: Diagnosis not present

## 2020-07-27 DIAGNOSIS — S52502D Unspecified fracture of the lower end of left radius, subsequent encounter for closed fracture with routine healing: Secondary | ICD-10-CM | POA: Diagnosis not present

## 2020-07-27 DIAGNOSIS — R29898 Other symptoms and signs involving the musculoskeletal system: Secondary | ICD-10-CM | POA: Diagnosis not present

## 2020-07-27 DIAGNOSIS — M25632 Stiffness of left wrist, not elsewhere classified: Secondary | ICD-10-CM | POA: Diagnosis not present

## 2020-08-30 ENCOUNTER — Encounter: Payer: Self-pay | Admitting: Neurology

## 2020-08-30 ENCOUNTER — Ambulatory Visit: Payer: Federal, State, Local not specified - PPO | Admitting: Neurology

## 2020-08-30 ENCOUNTER — Other Ambulatory Visit: Payer: Self-pay

## 2020-08-30 VITALS — BP 135/85 | HR 76 | Ht 68.0 in | Wt 185.0 lb

## 2020-08-30 DIAGNOSIS — G4719 Other hypersomnia: Secondary | ICD-10-CM

## 2020-08-30 DIAGNOSIS — G4761 Periodic limb movement disorder: Secondary | ICD-10-CM

## 2020-08-30 DIAGNOSIS — E663 Overweight: Secondary | ICD-10-CM

## 2020-08-30 DIAGNOSIS — G4733 Obstructive sleep apnea (adult) (pediatric): Secondary | ICD-10-CM | POA: Diagnosis not present

## 2020-08-30 DIAGNOSIS — Z789 Other specified health status: Secondary | ICD-10-CM

## 2020-08-30 DIAGNOSIS — Z820 Family history of epilepsy and other diseases of the nervous system: Secondary | ICD-10-CM

## 2020-08-30 DIAGNOSIS — G2581 Restless legs syndrome: Secondary | ICD-10-CM
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
## 2020-08-30 NOTE — Patient Instructions (Addendum)
It was nice to see you again today.    Here is what we discussed today and what we came up with as our plan for you:    Based on your symptoms and your exam I believe you are still at risk for obstructive sleep apnea (aka OSA), and I think we should proceed with a sleep study to determine whether you do or do not have OSA and how severe it is. Even, if you have mild OSA, I may want you to consider treatment with CPAP, as treatment of even borderline or mild sleep apnea can result and improvement of symptoms such as sleep disruption, daytime sleepiness, nighttime bathroom breaks, restless leg symptoms, improvement of headache syndromes, even improved mood disorder.   As explained, an attended sleep study, meaning, you get to stay overnight in the sleep lab, lets [place] monitor sleep-related behaviors such as sleep talking and leg movements in sleep, in addition to monitoring for sleep apnea.  A home sleep test is a screening tool for sleep apnea only, and unfortunately does not help with any other sleep-related diagnoses.  Please remember, the long-term risks and ramifications of untreated moderate to severe obstructive sleep apnea are: increased Cardiovascular disease, including congestive heart failure, stroke, difficult to control hypertension, treatment resistant obesity, arrhythmias, especially irregular heartbeat commonly known as A. Fib. (atrial fibrillation); even type 2 diabetes has been linked to untreated OSA.   Sleep apnea can cause disruption of sleep and sleep deprivation in most cases, which, in turn, can cause recurrent headaches, problems with memory, mood, concentration, focus, and vigilance. Most people with untreated sleep apnea report excessive daytime sleepiness, which can affect their ability to drive. Please do not drive if you feel sleepy. Patients with sleep apnea can also develop difficulty initiating and maintaining sleep (aka insomnia).   Having sleep apnea may increase your risk  for other sleep disorders, including involuntary behaviors sleep such as sleep terrors, sleep talking, sleepwalking.    Having sleep apnea can also increase your risk for restless leg syndrome and leg movements at night.   Please note that untreated obstructive sleep apnea may carry additional perioperative morbidity. Patients with significant obstructive sleep apnea (typically, in the moderate to severe degree) should receive, if possible, perioperative PAP (positive airway pressure) therapy and the surgeons and particularly the anesthesiologists should be informed of the diagnosis and the severity of the sleep disordered breathing.   I will likely see you back after your sleep study to go over the test results and where to go from there. We will call you after your sleep study to advise about the results (most likely, you will hear from [facility], my nurse) and to set up an appointment at the time, as necessary.    Our sleep lab administrative assistant will call you to schedule your sleep study and give you further instructions, regarding the check in process for the sleep study, arrival time, what to bring, when you can expect to leave after the study, etc., and to answer any other logistical questions you may have. If you don't hear back from her by about 2 weeks from now, please feel free to call her direct line at [phone] or you can call our general clinic number, or email [place] through My Chart.

## 2020-09-27 ENCOUNTER — Ambulatory Visit: Payer: Federal, State, Local not specified - PPO | Admitting: Neurology

## 2020-09-27 DIAGNOSIS — G4733 Obstructive sleep apnea (adult) (pediatric): Secondary | ICD-10-CM | POA: Diagnosis not present

## 2020-09-27 DIAGNOSIS — Z789 Other specified health status: Secondary | ICD-10-CM

## 2020-09-27 DIAGNOSIS — G4761 Periodic limb movement disorder: Secondary | ICD-10-CM

## 2020-09-27 DIAGNOSIS — G4719 Other hypersomnia: Secondary | ICD-10-CM

## 2020-09-27 DIAGNOSIS — Z820 Family history of epilepsy and other diseases of the nervous system: Secondary | ICD-10-CM

## 2020-09-27 DIAGNOSIS — G2581 Restless legs syndrome: Secondary | ICD-10-CM

## 2020-09-27 DIAGNOSIS — E663 Overweight: Secondary | ICD-10-CM

## 2020-10-03 ENCOUNTER — Telehealth: Payer: Self-pay | Admitting: Neurology

## 2020-10-03 NOTE — Telephone Encounter (Signed)
Pt had sleep study on 09/27/20, once results are available I will call pt and we will review and schedule f/u as recommended or if sooner it pt would prefer.

## 2020-10-03 NOTE — Telephone Encounter (Signed)
PT called wondering when she was going to be scheduled for sleep study follow up, I said nurse will be calling to schedule soon but I will let her know you called. She was thankful

## 2020-10-05 ENCOUNTER — Encounter: Payer: Self-pay | Admitting: Neurology

## 2020-10-05 NOTE — Progress Notes (Signed)
Patient recently presented for reevaluation of her sleep apnea. I saw her on 08/30/2020 at the request of Dr. Link Snuffer. She had tried CPAP therapy in the past but had trouble tolerating it. She has been using a dental device but has had some jaw pain from it intermittently. She had a home sleep test on 09/27/2020 which confirmed moderate obstructive sleep apnea. I would recommend trial of AutoPap therapy. She indicated that she would be willing to try it again. Please process order and talk to patient, she will need a follow-up appointment 31 to 89 days after starting treatment. Please talk to her about the importance of fulfilling compliance criteria for insurance coverage.

## 2020-10-05 NOTE — Addendum Note (Signed)
Addended by: [name] on: 10/05/2020 06:54 PM   Modules accepted: Orders

## 2020-10-05 NOTE — Procedures (Signed)
   [facility]  HOME SLEEP TEST (Watch PAT)  STUDY DATE: 09/27/20  DOB: 1965/12/31  MRN: [id number]  ORDERING CLINICIAN: [name], MD, PhD   REFERRING CLINICIANAlysia Penna, MD   CLINICAL INFORMATION/HISTORY: 55 -year-old woman with a history of scoliosis, anxiety, depression, left distal radius fracture with status post surgery in October 2021, and overweight state, who was diagnosed with moderate obstructive sleep apnea in 2018. She could not tolerate CPAP at the time. She used a dental device, but has had difficulty with tolerance over time.  Epworth sleepiness score: 15/24.  BMI: 28.1 kg/m  FINDINGS:   Total Record Time (hours, min): 6hrs  Total Sleep Time (hours, min):  5hrs   Percent REM (%):  21.1   Calculated pAHI (per hour):  20.5      REM pAHI:   9.7    NREM pAHI: 23.4   Oxygen Saturation (%) Mean: 95 Minimum oxygen saturation (%):    84   O2 Saturation Range (%): 84 - 99    O2Saturation (minutes) <=88%:4.0  Pulse Mean (bpm):  71  Pulse Range (55 -102)   IMPRESSION: OSA (obstructive sleep apnea)  RECOMMENDATION:  This home sleep test demonstrates moderate obstructive sleep apnea with a total AHI of 20.5/hour and O2 nadir of 84%. Treatment with positive airway pressure is recommended. The patient will be advised to proceed with an autoPAP titration/trial at home for now. A full night titration study may be considered to optimize treatment settings, if needed down the road. Please note that untreated obstructive sleep apnea may carry additional perioperative morbidity. Patients with significant obstructive sleep apnea should receive perioperative PAP therapy and the surgeons and particularly the anesthesiologist should be informed of the diagnosis and the severity of the sleep disordered breathing. The patient should be cautioned not to drive, work at heights, or operate dangerous or heavy equipment when tired or sleepy. Review  and reiteration of good sleep hygiene measures should be pursued with any patient. Other causes of the patient's symptoms, including circadian rhythm disturbances, an underlying mood disorder, medication effect and/or an underlying medical problem cannot be ruled out based on this test. Clinical correlation is recommended. The patient and her referring provider will be notified of the test results. The patient will be seen in follow up in sleep clinic at [facility].  I certify that I have reviewed the raw data recording prior to the issuance of this report in accordance with the standards of the American Academy of Sleep Medicine (AASM).  INTERPRETING PHYSICIAN:    [name], MD, PhD  Board Certified in Neurology and Sleep Medicine  [facility] [address] [place], [place] [address] [phone]

## 2020-10-06 ENCOUNTER — Telehealth: Payer: Self-pay | Admitting: Neurology

## 2020-10-06 NOTE — Telephone Encounter (Signed)
I called pt. I advised pt that Dr. Frances Furbish reviewed their sleep study results and found that pt has moderate sleep apnea. Dr. Frances Furbish recommends that pt starts auto CPAP. I reviewed PAP compliance expectations with the pt. Pt is agreeable to starting a CPAP. I advised pt that an order will be sent to a DME, [address], and [facility] will call the pt within about one week after they file with the pt's insurance. [facility] will show the pt how to use the machine, fit for masks, and troubleshoot the CPAP if needed. A follow up appt was made for insurance purposes with Dr. Frances Furbish on May 4,2022 at 2 pm. Pt verbalized understanding to arrive 15 minutes early and bring their CPAP. A letter with all of this information in it will be sent to the pt as a reminder. I verified with the pt that the address we have on file is correct. Pt verbalized understanding of results. Pt had no questions at this time but was encouraged to call back if questions arise. I have sent the order to [facility] and have received confirmation that they have received the order.

## 2020-10-06 NOTE — Telephone Encounter (Signed)
-----   Message from [name], MD sent at 10/05/2020  6:53 PM EST ----- Patient recently presented for reevaluation of her sleep apnea. I saw her on 08/30/2020 at the request of Dr. Link Snuffer. She had tried CPAP therapy in the past but had trouble tolerating it. She has been using a dental device but has had some jaw pain from it intermittently. She had a home sleep test on 09/27/2020 which confirmed moderate obstructive sleep apnea. I would recommend trial of AutoPap therapy. She indicated that she would be willing to try it again. Please process order and talk to patient, she will need a follow-up appointment 31 to 89 days after starting treatment. Please talk to her about the importance of fulfilling compliance criteria for insurance coverage.

## 2020-10-20 DIAGNOSIS — G4733 Obstructive sleep apnea (adult) (pediatric): Secondary | ICD-10-CM | POA: Diagnosis not present

## 2020-11-20 DIAGNOSIS — G4733 Obstructive sleep apnea (adult) (pediatric): Secondary | ICD-10-CM | POA: Diagnosis not present

## 2020-12-14 DIAGNOSIS — M461 Sacroiliitis, not elsewhere classified: Secondary | ICD-10-CM | POA: Diagnosis not present

## 2020-12-14 DIAGNOSIS — M4325 Fusion of spine, thoracolumbar region: Secondary | ICD-10-CM | POA: Diagnosis not present

## 2020-12-20 ENCOUNTER — Ambulatory Visit: Payer: Self-pay | Admitting: Neurology

## 2020-12-20 DIAGNOSIS — G4733 Obstructive sleep apnea (adult) (pediatric): Secondary | ICD-10-CM | POA: Diagnosis not present

## 2020-12-29 DIAGNOSIS — M791 Myalgia, unspecified site: Secondary | ICD-10-CM | POA: Diagnosis not present

## 2020-12-29 DIAGNOSIS — M4325 Fusion of spine, thoracolumbar region: Secondary | ICD-10-CM | POA: Diagnosis not present

## 2020-12-29 DIAGNOSIS — M47816 Spondylosis without myelopathy or radiculopathy, lumbar region: Secondary | ICD-10-CM | POA: Diagnosis not present

## 2020-12-29 DIAGNOSIS — M461 Sacroiliitis, not elsewhere classified: Secondary | ICD-10-CM | POA: Diagnosis not present

## 2021-01-02 ENCOUNTER — Ambulatory Visit: Payer: Self-pay | Admitting: Neurology

## 2021-01-18 DIAGNOSIS — H9201 Otalgia, right ear: Secondary | ICD-10-CM | POA: Diagnosis not present

## 2021-01-18 DIAGNOSIS — M2669 Other specified disorders of temporomandibular joint: Secondary | ICD-10-CM | POA: Diagnosis not present

## 2021-01-20 DIAGNOSIS — G4733 Obstructive sleep apnea (adult) (pediatric): Secondary | ICD-10-CM | POA: Diagnosis not present

## 2021-02-19 DIAGNOSIS — G4733 Obstructive sleep apnea (adult) (pediatric): Secondary | ICD-10-CM | POA: Diagnosis not present

## 2021-02-27 DIAGNOSIS — Z20822 Contact with and (suspected) exposure to covid-19: Secondary | ICD-10-CM | POA: Diagnosis not present

## 2021-02-27 DIAGNOSIS — J029 Acute pharyngitis, unspecified: Secondary | ICD-10-CM | POA: Diagnosis not present

## 2021-02-27 DIAGNOSIS — R519 Headache, unspecified: Secondary | ICD-10-CM | POA: Diagnosis not present

## 2021-02-28 ENCOUNTER — Ambulatory Visit: Payer: Self-pay | Admitting: Neurology

## 2021-03-22 DIAGNOSIS — G4733 Obstructive sleep apnea (adult) (pediatric): Secondary | ICD-10-CM | POA: Diagnosis not present

## 2021-03-29 DIAGNOSIS — L821 Other seborrheic keratosis: Secondary | ICD-10-CM | POA: Diagnosis not present

## 2021-04-22 DIAGNOSIS — G4733 Obstructive sleep apnea (adult) (pediatric): Secondary | ICD-10-CM | POA: Diagnosis not present

## 2021-06-21 DIAGNOSIS — Z0000 Encounter for general adult medical examination without abnormal findings: Secondary | ICD-10-CM | POA: Diagnosis not present

## 2021-06-28 ENCOUNTER — Other Ambulatory Visit: Payer: Self-pay | Admitting: Obstetrics & Gynecology

## 2021-06-28 DIAGNOSIS — R928 Other abnormal and inconclusive findings on diagnostic imaging of breast: Secondary | ICD-10-CM

## 2021-07-24 ENCOUNTER — Ambulatory Visit: Payer: Federal, State, Local not specified - PPO

## 2021-07-24 ENCOUNTER — Ambulatory Visit
Admission: RE | Admit: 2021-07-24 | Discharge: 2021-07-24 | Disposition: A | Discharge status: Home / Self Care | Payer: 59 | Source: Ambulatory Visit | Attending: Obstetrics & Gynecology | Admitting: Obstetrics & Gynecology

## 2021-07-24 DIAGNOSIS — R928 Other abnormal and inconclusive findings on diagnostic imaging of breast: Secondary | ICD-10-CM

## 2021-08-03 LAB — COLOGUARD: COLOGUARD: NEGATIVE

## 2021-08-23 ENCOUNTER — Other Ambulatory Visit: Payer: Self-pay | Admitting: Rehabilitation

## 2021-08-23 DIAGNOSIS — M47816 Spondylosis without myelopathy or radiculopathy, lumbar region: Secondary | ICD-10-CM

## 2021-09-02 ENCOUNTER — Other Ambulatory Visit: Payer: Self-pay

## 2021-09-02 ENCOUNTER — Ambulatory Visit
Admission: RE | Admit: 2021-09-02 | Discharge: 2021-09-02 | Disposition: A | Discharge status: Home / Self Care | Payer: 59 | Source: Ambulatory Visit | Attending: Rehabilitation | Admitting: Rehabilitation

## 2021-09-02 DIAGNOSIS — M47816 Spondylosis without myelopathy or radiculopathy, lumbar region: Secondary | ICD-10-CM

## 2021-09-27 ENCOUNTER — Other Ambulatory Visit: Payer: Self-pay | Admitting: Obstetrics & Gynecology

## 2021-09-27 DIAGNOSIS — R1030 Lower abdominal pain, unspecified: Secondary | ICD-10-CM

## 2021-09-28 ENCOUNTER — Ambulatory Visit
Admission: RE | Admit: 2021-09-28 | Discharge: 2021-09-28 | Disposition: A | Discharge status: Home / Self Care | Payer: 59 | Source: Ambulatory Visit | Attending: Obstetrics & Gynecology | Admitting: Obstetrics & Gynecology

## 2021-09-28 DIAGNOSIS — R1030 Lower abdominal pain, unspecified: Secondary | ICD-10-CM

## 2021-10-08 ENCOUNTER — Other Ambulatory Visit: Payer: Self-pay | Admitting: Internal Medicine

## 2021-10-08 DIAGNOSIS — N281 Cyst of kidney, acquired: Secondary | ICD-10-CM

## 2021-10-09 ENCOUNTER — Ambulatory Visit
Admission: RE | Admit: 2021-10-09 | Discharge: 2021-10-09 | Disposition: A | Discharge status: Home / Self Care | Payer: 59 | Source: Ambulatory Visit | Attending: Internal Medicine | Admitting: Internal Medicine

## 2021-10-09 DIAGNOSIS — N281 Cyst of kidney, acquired: Secondary | ICD-10-CM

## 2021-11-05 ENCOUNTER — Other Ambulatory Visit: Payer: Self-pay

## 2021-11-05 ENCOUNTER — Ambulatory Visit: Payer: 59 | Admitting: Urology

## 2021-11-05 ENCOUNTER — Encounter: Payer: Self-pay | Admitting: Urology

## 2021-11-05 VITALS — BP 105/67 | HR 78 | Ht 68.0 in | Wt 183.0 lb

## 2021-11-05 DIAGNOSIS — N281 Cyst of kidney, acquired: Secondary | ICD-10-CM | POA: Diagnosis not present

## 2021-11-05 DIAGNOSIS — N1330 Unspecified hydronephrosis: Secondary | ICD-10-CM

## 2021-11-05 LAB — URINALYSIS, ROUTINE W REFLEX MICROSCOPIC
Bilirubin, UA: NEGATIVE
Glucose, UA: NEGATIVE
Ketones, UA: NEGATIVE
Leukocytes,UA: NEGATIVE
Nitrite, UA: NEGATIVE
Protein,UA: NEGATIVE
RBC, UA: NEGATIVE
Specific Gravity, UA: >1.03 — ABNORMAL HIGH (ref 1.005–1.030)
Urobilinogen, Ur: 1 mg/dL (ref 0.2–1.0)
pH, UA: 5.5 (ref 5.0–7.5)

## 2021-11-05 NOTE — Patient Instructions (Signed)

## 2021-11-05 NOTE — Progress Notes (Signed)
? ?11/05/2021 ?10:14 AM  ? ?Heather Davis ?1965/12/07 ?[id number] ? ?Referring provider: Alysia Penna, MD ?[address] ?[place],  [place] [address] ? ?Renal cysts and left hydronephrosis ? ? ?HPI: ?Heather Davis is a  55yo here for evaluation of left hydronephrosis and bilateral renal cysts.She has had bilateral flank pain for 4-5 months. She underwent MRI which showed renal cysts and then she underwent renal US  09/2021 which showed mild left hydronephrosis. She has a kidney stone 10 years ago. She has mild urinary urgency, frequency, and nocturia which is new in the past 4 months  ? ? ?PMH: ?Past Medical History:  ?Diagnosis Date  ? Anxiety   ? Complication of anesthesia   ? Left wrist fracture   ? Major depression in remission [facility])   ? PONV (postoperative nausea and vomiting)   ? Scoliosis   ? Sleep apnea   ? mouth guard nightly  ? Varicose veins   ? ? ?Surgical History: ?Past Surgical History:  ?Procedure Laterality Date  ? APPENDECTOMY    ? BACK SURGERY  09/2015  ? CERVICAL FUSION    ? CESAREAN SECTION    ? CHOLECYSTECTOMY    ? ORIF WRIST FRACTURE Left 06/08/2020  ? Procedure: OPEN REDUCTION INTERNAL FIXATION (ORIF) WRIST FRACTURE;  Surgeon: Bjorn Pippin, MD;  Location: [facility];  Service: Orthopedics;  Laterality: Left;  ? PANNICULECTOMY    ? ? ?Home Medications:  ?Allergies as of 11/05/2021   ?No Known Allergies ?  ? ?  ?Medication List  ?  ? ?  ? Accurate as of November 05, 2021 10:14 AM. If you have any questions, ask your nurse or doctor.  ?  ?  ? ?  ? ?citalopram 40 MG tablet ?Commonly known as: CELEXA ?Take 40 mg by mouth. ?  ?estradiol 0.5 MG tablet ?Commonly known as: ESTRACE ?Take 0.5 mg by mouth daily. ?  ?medroxyPROGESTERone 5 MG tablet ?Commonly known as: PROVERA ?Take 5 mg by mouth daily. ?  ?REQUIP PO ?Take by mouth at bedtime. ?  ?rosuvastatin 10 MG tablet ?Commonly known as: CRESTOR ?Take 10 mg by mouth at bedtime. ?  ?traMADol 50 MG tablet ?Commonly known as: ULTRAM ?Take 50 mg by  mouth daily as needed. ?  ? ?  ? ? ?Allergies: No Known Allergies ? ?Family History: ?Family History  ?Problem Relation Age of Onset  ? Diabetes Mother   ? Heart disease Mother   ? Hyperlipidemia Mother   ? Breast cancer Neg Hx   ? ? ?Social History:  reports that she has never smoked. She has never used smokeless tobacco. She reports current alcohol use. She reports that she does not use drugs. ? ?ROS: ?All other review of systems were reviewed and are negative except what is noted above in HPI ? ?Physical Exam: ?BP 105/67   Pulse 78   Ht 5\' 8"  (1.727 m)   Wt 183 lb (83 kg)   BMI 27.83 kg/m?   ?Constitutional:  Alert and oriented, No acute distress. ?HEENT: [place] AT, moist mucus membranes.  Trachea midline, no masses. ?Cardiovascular: No clubbing, cyanosis, or edema. ?Respiratory: Normal respiratory effort, no increased work of breathing. ?GI: Abdomen is soft, nontender, nondistended, no abdominal masses ?GU: No CVA tenderness.  ?Lymph: No cervical or inguinal lymphadenopathy. ?Skin: No rashes, bruises or suspicious lesions. ?Neurologic: Grossly intact, no focal deficits, moving all 4 extremities. ?Psychiatric: Normal mood and affect. ? ?Laboratory Data: ?Lab Results  ?Component Value Date  ?  WBC 9.4 06/12/2008  ? HGB 9.8 (L) 06/12/2008  ? HCT 29.1 (L) 06/12/2008  ? MCV 94.0 06/12/2008  ? PLT 196 06/12/2008  ? ? ?Lab Results  ?Component Value Date  ? CREATININE 0.75 07/12/2008  ? ? ?No results found for: PSA ? ?No results found for: TESTOSTERONE ? ?No results found for: HGBA1C ? ?Urinalysis ?   ?Component Value Date/Time  ? COLORURINE straw 11/15/2009 1132  ? APPEARANCEUR Hazy 11/15/2009 1132  ? LABSPEC >=1.030 11/15/2009 1132  ? PHURINE 5.5 11/15/2009 1132  ? GLUCOSEU NEGATIVE 08/13/2009 0809  ? HGBUR trace-intact 11/15/2009 1132  ? BILIRUBINUR small 11/15/2009 1132  ? KETONESUR NEGATIVE 08/13/2009 0809  ? PROTEINUR NEGATIVE 08/13/2009 0809  ? UROBILINOGEN 0.2 11/15/2009 1132  ? NITRITE negative 11/15/2009 1132   ? LEUKOCYTESUR NEGATIVE 08/13/2009 0809  ? ? ?Lab Results  ?Component Value Date  ? BACTERIA FEW (A) 08/13/2009  ? ? ?Pertinent Imaging: ?Renal US 10/09/2021: Images reviewed and discussed with the patient ?No results found for this or any previous visit. ? ?No results found for this or any previous visit. ? ?No results found for this or any previous visit. ? ?No results found for this or any previous visit. ? ?Results for orders placed during the hospital encounter of 10/09/21 ? ?US RENAL ? ?Narrative ?CLINICAL DATA:  Evaluate parapelvic renal cyst. ? ?EXAM: ?RENAL / URINARY TRACT ULTRASOUND COMPLETE ? ?COMPARISON:  November 15, 2003 ? ?FINDINGS: ?Right Kidney: ? ?Renal measurements: 10.6 cm x 5.5 cm x 6.4 cm = volume: 199.8 mL. ?Echogenicity within normal limits. 0.9 cm x 0.9 cm x 0.8 cm and 1.1 ?cm x 1.0 cm x 1.1 cm anechoic structures are seen within the mid and ?upper right kidney. No abnormal flow is seen within these areas on ?color Doppler evaluation. No hydronephrosis is visualized. ? ?Left Kidney: ? ?Renal measurements: 12.5 cm x 5.4 cm x 4.2 cm = volume: 147.1 mL. ?Echogenicity within normal limits. 1.1 cm x 1.2 cm x 1.0 cm and 1.4 ?cm x 1.4 cm x 1.3 cm anechoic structures are seen within the mid and ?upper left kidney. No abnormal flow is seen within these regions on ?color Doppler evaluation. There is mild left-sided hydronephrosis. ? ?Bladder: ? ?Appears normal for degree of bladder distention. ? ?Other: ? ?None. ? ?IMPRESSION: ?1. Mild left-sided hydronephrosis. ?2. Multiple bilateral simple renal cysts. ? ? ?Electronically Signed ?By: Aram Candela M.D. ?On: 10/10/2021 19:53 ? ?No results found for this or any previous visit. ? ?No results found for this or any previous visit. ? ?No results found for this or any previous visit. ? ? ?Assessment & Plan:   ? ?1. Hydronephrosis, unspecified hydronephrosis type ?-CT hematuria ?-BMP ?- Urinalysis, Routine w reflex microscopic ? ?2. Renal cyst ?-Ct  hematuria ?- Urinalysis, Routine w reflex microscopic ? ? ?No follow-ups on file. ? ?Wilkie Aye, MD ? ?[facility] ?  ?

## 2021-11-06 LAB — BASIC METABOLIC PANEL
BUN/Creatinine Ratio: 13 (ref 9–23)
BUN: 9 mg/dL (ref 6–24)
CO2: 22 mmol/L (ref 20–29)
Calcium: 9.4 mg/dL (ref 8.7–10.2)
Chloride: 105 mmol/L (ref 96–106)
Creatinine, Ser: 0.7 mg/dL (ref 0.57–1.00)
Glucose: 92 mg/dL (ref 70–99)
Potassium: 4.4 mmol/L (ref 3.5–5.2)
Sodium: 141 mmol/L (ref 134–144)
eGFR: 103 mL/min/{1.73_m2} (ref >59)

## 2021-11-08 ENCOUNTER — Ambulatory Visit (HOSPITAL_BASED_OUTPATIENT_CLINIC_OR_DEPARTMENT_OTHER): Payer: 59

## 2021-11-10 ENCOUNTER — Other Ambulatory Visit: Payer: Self-pay

## 2021-11-10 ENCOUNTER — Ambulatory Visit (HOSPITAL_BASED_OUTPATIENT_CLINIC_OR_DEPARTMENT_OTHER)
Admission: RE | Admit: 2021-11-10 | Discharge: 2021-11-10 | Disposition: A | Discharge status: Home / Self Care | Payer: 59 | Source: Ambulatory Visit | Attending: Urology | Admitting: Urology

## 2021-11-10 DIAGNOSIS — N281 Cyst of kidney, acquired: Secondary | ICD-10-CM | POA: Diagnosis present

## 2021-11-10 DIAGNOSIS — N1330 Unspecified hydronephrosis: Secondary | ICD-10-CM | POA: Diagnosis present

## 2021-11-10 MED ORDER — IOHEXOL 300 MG/ML  SOLN
100.0000 mL | Freq: Once | INTRAMUSCULAR | Status: AC | PRN
  Administered 2021-11-10: 100 mL via INTRAVENOUS

## 2021-11-21 ENCOUNTER — Ambulatory Visit (INDEPENDENT_AMBULATORY_CARE_PROVIDER_SITE_OTHER): Payer: 59 | Admitting: Urology

## 2021-11-21 ENCOUNTER — Encounter: Payer: Self-pay | Admitting: Urology

## 2021-11-21 VITALS — BP 106/72 | HR 81

## 2021-11-21 DIAGNOSIS — N1330 Unspecified hydronephrosis: Secondary | ICD-10-CM

## 2021-11-21 LAB — URINALYSIS, ROUTINE W REFLEX MICROSCOPIC
Bilirubin, UA: NEGATIVE
Glucose, UA: NEGATIVE
Leukocytes,UA: NEGATIVE
Nitrite, UA: NEGATIVE
Protein,UA: NEGATIVE
RBC, UA: NEGATIVE
Specific Gravity, UA: >1.03 — ABNORMAL HIGH (ref 1.005–1.030)
Urobilinogen, Ur: 1 mg/dL (ref 0.2–1.0)
pH, UA: 5.5 (ref 5.0–7.5)

## 2021-11-21 NOTE — Patient Instructions (Signed)
Hydronephrosis ?Hydronephrosis is the swelling of one or both kidneys due to a blockage that stops urine from flowing out of the body. Kidneys filter waste from the blood and produce urine. This condition can lead to kidney failure and may become life-threatening if not treated promptly. ?What are the causes? ?In infants and children, common causes include problems that occur when a baby is developing in the womb. These can include problems in the kidneys or in the tubes that drain urine into the bladder (ureters). ?In adults, common causes include: ?Kidney stones. ?Pregnancy. ?A tumor or cyst in the abdomen or pelvis. ?An enlarged prostate gland. ?Other causes include: ?Bladder infection. ?Scar tissue from a previous surgery or injury. ?A blood clot. ?Cancer of the prostate, bladder, uterus, ovary, or colon. ?What are the signs or symptoms? ?Symptoms of this condition include: ?Pain or discomfort in your side (flank) or abdomen. ?Swelling in your abdomen. ?Nausea and vomiting. ?Fever. ?Pain when passing urine. ?Feelings of urgency when you need to urinate. ?Urinating more often than normal. ?In some cases, you may not have any symptoms. ?How is this diagnosed? ?This condition may be diagnosed based on: ?Your symptoms and medical history. ?A physical exam. ?Blood and urine tests. ?Imaging tests, such as an ultrasound, CT scan, or MRI. ?A procedure to look at your urinary tract and bladder by inserting a scope into the urethra (cystoscopy). ?How is this treated? ?Treatment for this condition depends on where the blockage is, how long it has been there, and what caused it. The goal of treatment is to remove the blockage. Treatment may include: ?Antibiotic medicines to treat or prevent infection. ?A procedure to place a small, thin tube (stent) into a blocked ureter. The stent will keep the ureter open so that urine can drain through it. ?A nonsurgical procedure that crushes kidney stones with shock waves  (extracorporeal shock wave lithotripsy). ?If kidney failure occurs, treatment may include dialysis or a kidney transplant. ?Follow these instructions at home: ? ?Take over-the-counter and prescription medicines only as told by your health care provider. ?If you were prescribed an antibiotic medicine, take it exactly as told by your health care provider. Do not stop taking the antibiotic even if you start to feel better. ?Rest and return to your normal activities as told by your health care provider. Ask your health care provider what activities are safe for you. ?Drink enough fluid to keep your urine pale yellow. ?Keep all follow-up visits. This is important. ?Contact a health care provider if: ?You continue to have symptoms after treatment. ?You develop new symptoms. ?Your urine becomes cloudy or bloody. ?You have a fever. ?Get help right away if: ?You have severe flank or abdominal pain. ?You cannot drink fluids without vomiting. ?Summary ?Hydronephrosis is the swelling of one or both kidneys due to a blockage that stops urine from flowing out of the body. ?Hydronephrosis can lead to kidney failure and may become life-threatening if not treated promptly. ?The goal of treatment is to remove the blockage. It may include a procedure to insert a stent into a blocked ureter, a procedure to break up kidney stones, or taking antibiotic medicines. ?Follow your health care provider's instructions for taking care of yourself at home, including instructions about drinking fluids, taking medicines, and limiting activities. ?This information is not intended to replace advice given to you by your health care provider. Make sure you discuss any questions you have with your health care provider. ?Document Revised: 11/23/2019 Document Reviewed: 11/23/2019 ?Elsevier Patient   Education ? 2022 [employer] ? ?

## 2021-11-21 NOTE — Progress Notes (Signed)
? ?11/21/2021 ?1:53 PM  ? ?Heather Davis ?June 22, 1966 ?[id number] ? ?Referring provider: Velna Hatchet, MD ?[address] ?[place],  [place] [address] ? ?Followup left hydronephrosis ? ? ?HPI: ?Heather Davis is a 56yo here for followup for left hydronephrosis. She underwent CT hematuria 3/27 which showed numerous bilateral peripelvic cysts but no left hydronephrosis. Creatinine normal. No family history of polycystic kidney disease. No flak pain. No significant LUTS. No gross hematuria. No other complaints today ? ? ?PMH: ?Past Medical History:  ?Diagnosis Date  ? Anxiety   ? Complication of anesthesia   ? Left wrist fracture   ? Major depression in remission [facility])   ? PONV (postoperative nausea and vomiting)   ? Scoliosis   ? Sleep apnea   ? mouth guard nightly  ? Varicose veins   ? ? ?Surgical History: ?Past Surgical History:  ?Procedure Laterality Date  ? APPENDECTOMY    ? BACK SURGERY  09/2015  ? CERVICAL FUSION    ? CESAREAN SECTION    ? CHOLECYSTECTOMY    ? ORIF WRIST FRACTURE Left 06/08/2020  ? Procedure: OPEN REDUCTION INTERNAL FIXATION (ORIF) WRIST FRACTURE;  Surgeon: Hiram Gash, MD;  Location: [facility];  Service: Orthopedics;  Laterality: Left;  ? PANNICULECTOMY    ? ? ?Home Medications:  ?Allergies as of 11/21/2021   ?No Known Allergies ?  ? ?  ?Medication List  ?  ? ?  ? Accurate as of November 21, 2021  1:53 PM. If you have any questions, ask your nurse or doctor.  ?  ?  ? ?  ? ?citalopram 40 MG tablet ?Commonly known as: CELEXA ?Take 40 mg by mouth. ?  ?estradiol 0.5 MG tablet ?Commonly known as: ESTRACE ?Take 0.5 mg by mouth daily. ?  ?medroxyPROGESTERone 5 MG tablet ?Commonly known as: PROVERA ?Take 5 mg by mouth daily. ?  ?medroxyPROGESTERone 2.5 MG tablet ?Commonly known as: PROVERA ?Take 2.5 mg by mouth daily. ?  ?REQUIP PO ?Take by mouth at bedtime. ?  ?rosuvastatin 10 MG tablet ?Commonly known as: CRESTOR ?Take 10 mg by mouth at bedtime. ?  ?traMADol 50 MG tablet ?Commonly known as:  ULTRAM ?Take 50 mg by mouth daily as needed. ?  ? ?  ? ? ?Allergies: No Known Allergies ? ?Family History: ?Family History  ?Problem Relation Age of Onset  ? Diabetes Mother   ? Heart disease Mother   ? Hyperlipidemia Mother   ? Breast cancer Neg Hx   ? ? ?Social History:  reports that she has never smoked. She has never used smokeless tobacco. She reports current alcohol use. She reports that she does not use drugs. ? ?ROS: ?All other review of systems were reviewed and are negative except what is noted above in HPI ? ?Physical Exam: ?BP 106/72   Pulse 81   ?Constitutional:  Alert and oriented, No acute distress. ?HEENT: [place] AT, moist mucus membranes.  Trachea midline, no masses. ?Cardiovascular: No clubbing, cyanosis, or edema. ?Respiratory: Normal respiratory effort, no increased work of breathing. ?GI: Abdomen is soft, nontender, nondistended, no abdominal masses ?GU: No CVA tenderness.  ?Lymph: No cervical or inguinal lymphadenopathy. ?Skin: No rashes, bruises or suspicious lesions. ?Neurologic: Grossly intact, no focal deficits, moving all 4 extremities. ?Psychiatric: Normal mood and affect. ? ?Laboratory Data: ?Lab Results  ?Component Value Date  ? WBC 9.4 06/12/2008  ? HGB 9.8 (L) 06/12/2008  ? HCT 29.1 (L) 06/12/2008  ? MCV 94.0 06/12/2008  ? PLT 196 06/12/2008  ? ? ?  Lab Results  ?Component Value Date  ? CREATININE 0.70 11/05/2021  ? ? ?No results found for: PSA ? ?No results found for: TESTOSTERONE ? ?No results found for: HGBA1C ? ?Urinalysis ?   ?Component Value Date/Time  ? COLORURINE straw 11/15/2009 1132  ? APPEARANCEUR Clear 11/05/2021 1012  ? LABSPEC >=1.030 11/15/2009 1132  ? PHURINE 5.5 11/15/2009 1132  ? GLUCOSEU Negative 11/05/2021 1012  ? HGBUR trace-intact 11/15/2009 1132  ? BILIRUBINUR Negative 11/05/2021 1012  ? Venturia NEGATIVE 08/13/2009 0809  ? PROTEINUR Negative 11/05/2021 1012  ? [place] NEGATIVE 08/13/2009 0809  ? UROBILINOGEN 0.2 11/15/2009 1132  ? NITRITE Negative 11/05/2021 1012   ? NITRITE negative 11/15/2009 1132  ? LEUKOCYTESUR Negative 11/05/2021 1012  ? ? ?Lab Results  ?Component Value Date  ? LABMICR Comment 11/05/2021  ? BACTERIA FEW (A) 08/13/2009  ? ? ?Pertinent Imaging: ?CT 11/12/2021: Images reviewed and discussed with the patient  ?No results found for this or any previous visit. ? ?No results found for this or any previous visit. ? ?No results found for this or any previous visit. ? ?No results found for this or any previous visit. ? ?Results for orders placed during the hospital encounter of 10/09/21 ? ?US RENAL ? ?Narrative ?CLINICAL DATA:  Evaluate parapelvic renal cyst. ? ?EXAM: ?RENAL / URINARY TRACT ULTRASOUND COMPLETE ? ?COMPARISON:  November 15, 2003 ? ?FINDINGS: ?Right Kidney: ? ?Renal measurements: 10.6 cm x 5.5 cm x 6.4 cm = volume: 199.8 mL. ?Echogenicity within normal limits. 0.9 cm x 0.9 cm x 0.8 cm and 1.1 ?cm x 1.0 cm x 1.1 cm anechoic structures are seen within the mid and ?upper right kidney. No abnormal flow is seen within these areas on ?color Doppler evaluation. No hydronephrosis is visualized. ? ?Left Kidney: ? ?Renal measurements: 12.5 cm x 5.4 cm x 4.2 cm = volume: 147.1 mL. ?Echogenicity within normal limits. 1.1 cm x 1.2 cm x 1.0 cm and 1.4 ?cm x 1.4 cm x 1.3 cm anechoic structures are seen within the mid and ?upper left kidney. No abnormal flow is seen within these regions on ?color Doppler evaluation. There is mild left-sided hydronephrosis. ? ?Bladder: ? ?Appears normal for degree of bladder distention. ? ?Other: ? ?None. ? ?IMPRESSION: ?1. Mild left-sided hydronephrosis. ?2. Multiple bilateral simple renal cysts. ? ? ?Electronically Signed ?By: Virgina Norfolk M.D. ?On: 10/10/2021 19:53 ? ?No results found for this or any previous visit. ? ?Results for orders placed during the hospital encounter of 11/10/21 ? ?CT HEMATURIA WORKUP ? ?Narrative ?CLINICAL DATA:  LEFT-sided hydronephrosis in a 56 year old female. ? ?EXAM: ?CT ABDOMEN AND PELVIS WITHOUT  AND WITH CONTRAST ? ?TECHNIQUE: ?Multidetector CT imaging of the abdomen and pelvis was performed ?following the standard protocol before and following the bolus ?administration of intravenous contrast. ? ?RADIATION DOSE REDUCTION: This exam was performed according to the ?departmental dose-optimization program which includes automated ?exposure control, adjustment of the mA and/or kV according to ?patient size and/or use of iterative reconstruction technique. ? ?CONTRAST:  112mL OMNIPAQUE IOHEXOL 300 MG/ML  SOLN ? ?COMPARISON:  2010 CT evaluation. ? ?FINDINGS: ?Lower chest: Lung bases are clear.  No effusion or consolidation. ? ?Hepatobiliary: No focal, suspicious hepatic lesion. Post ?cholecystectomy without biliary duct distension. Portal vein is ?patent. ? ?Pancreas: Normal, without mass, inflammation or ductal dilatation. ? ?Spleen: Normal. ? ?Adrenals/Urinary Tract: Smooth renal contours. No hydronephrosis. No ?suspicious renal lesion, nephrolithiasis or perinephric stranding. ?Renal sinus cysts on the LEFT. Renal sinus cysts also on the  RIGHT ?to a lesser extent. Small 5 mm low-attenuation lesion arising from ?the lateral RIGHT kidney compatible with small cyst. ? ?Stomach/Bowel: Post appendectomy. No acute gastrointestinal ?findings. No perigastric stranding or small bowel dilation. ? ?Vascular/Lymphatic: ? ?Aortic atherosclerosis. No sign of aneurysm. Smooth contour of the ?IVC. There is no gastrohepatic or hepatoduodenal ligament ?lymphadenopathy. No retroperitoneal or mesenteric lymphadenopathy. ? ?No pelvic sidewall lymphadenopathy. ? ?Minimal atherosclerotic calcification scattered throughout the ?abdominal aorta. ? ?Reproductive: Unremarkable CT appearance of the uterus and adnexa. ? ?Other: No ascites. No pneumoperitoneum. Postoperative changes about ?the ventral abdominal wall. ? ?Musculoskeletal: Spinal degenerative changes. Post thoracolumbar ?spinal fusion with levoconvex scoliotic curvature in  the lumbar ?spine, dextroconvexity in the thoracic spine partially visible. L2 ?with moderate loss of height is similar to prior MR imaging. ? ?Excretory phase: Signs of renal sinus cysts actually bilaterally ?g

## 2021-12-26 DIAGNOSIS — R82998 Other abnormal findings in urine: Secondary | ICD-10-CM | POA: Diagnosis not present

## 2022-04-19 DIAGNOSIS — M4325 Fusion of spine, thoracolumbar region: Secondary | ICD-10-CM | POA: Diagnosis not present

## 2022-04-19 DIAGNOSIS — M47816 Spondylosis without myelopathy or radiculopathy, lumbar region: Secondary | ICD-10-CM | POA: Diagnosis not present

## 2022-04-19 DIAGNOSIS — M25522 Pain in left elbow: Secondary | ICD-10-CM | POA: Diagnosis not present

## 2022-05-08 DIAGNOSIS — M461 Sacroiliitis, not elsewhere classified: Secondary | ICD-10-CM | POA: Diagnosis not present

## 2022-05-29 DIAGNOSIS — M461 Sacroiliitis, not elsewhere classified: Secondary | ICD-10-CM | POA: Diagnosis not present

## 2022-05-29 DIAGNOSIS — Z6827 Body mass index (BMI) 27.0-27.9, adult: Secondary | ICD-10-CM | POA: Diagnosis not present

## 2022-05-29 DIAGNOSIS — M47816 Spondylosis without myelopathy or radiculopathy, lumbar region: Secondary | ICD-10-CM | POA: Diagnosis not present

## 2022-06-17 ENCOUNTER — Ambulatory Visit
Admission: EM | Admit: 2022-06-17 | Discharge: 2022-06-17 | Disposition: A | Discharge status: Home / Self Care | Payer: Federal, State, Local not specified - PPO

## 2022-06-17 DIAGNOSIS — R0789 Other chest pain: Secondary | ICD-10-CM | POA: Diagnosis not present

## 2022-06-17 MED ORDER — METHYLPREDNISOLONE SODIUM SUCC 125 MG IJ SOLR
80.0000 mg | Freq: Once | INTRAMUSCULAR | Status: DC
  Administered 2022-06-17: 80 mg via INTRAMUSCULAR

## 2022-06-17 NOTE — ED Triage Notes (Signed)
Pt c/o soreness above right breast. Unable to articulate if pain is more so in breast or under chest cavity. Onset ~ Friday. Describes sharp pain with deep breath or physical exertion (pt ex is opening door.)

## 2022-06-19 ENCOUNTER — Encounter: Payer: Self-pay | Admitting: Physician Assistant

## 2022-06-19 NOTE — ED Provider Notes (Addendum)
[facility]    CSN: [id number] Arrival date & time: 06/17/22  1703      History   Chief Complaint Chief Complaint  Patient presents with   Chest Pain    HPI Heather Davis is a 56 y.o. female.   Patient here today for evaluation of right chest wall pain that started recently.  She reports that the area is tender to palpation and she notices pain with certain movements with example of opening a door.  She has not had any numbness or tingling.  She denies any fever.  She has not felt any breast abnormality or lumps and denies any nipple discharge.  She is due for her mammogram in December, and states last December she had nonconcerning mammogram.  She has not had any other symptoms.  The history is provided by the patient.  Chest Pain Associated symptoms: no fever, no nausea, no shortness of breath and no vomiting     Past Medical History:  Diagnosis Date   Anxiety    Complication of anesthesia    Left wrist fracture    Major depression in remission (HCC)    PONV (postoperative nausea and vomiting)    Scoliosis    Sleep apnea    mouth guard nightly   Varicose veins     Patient Active Problem List   Diagnosis Date Noted   Varicose veins of leg with complications 05/13/2016   Varicose veins of bilateral lower extremities with other complications 05/06/2016    Past Surgical History:  Procedure Laterality Date   APPENDECTOMY     BACK SURGERY  09/2015   CERVICAL FUSION     CESAREAN SECTION     CHOLECYSTECTOMY     ORIF WRIST FRACTURE Left 06/08/2020   Procedure: OPEN REDUCTION INTERNAL FIXATION (ORIF) WRIST FRACTURE;  Surgeon: Bjorn Pippin, MD;  Location: [facility];  Service: Orthopedics;  Laterality: Left;   PANNICULECTOMY      OB History   No obstetric history on file.      Home Medications    Prior to Admission medications   Medication Sig Start Date End Date Taking? Authorizing Provider  ALPRAZolam (XANAX) 0.25 MG tablet Take  0.25 mg by mouth daily as needed. 03/12/22   [provider]  celecoxib (CELEBREX) 200 MG capsule Take 200 mg by mouth daily. 03/11/22   [provider]  citalopram (CELEXA) 40 MG tablet Take 40 mg by mouth.  09/20/15   [provider]  estradiol (ESTRACE) 0.5 MG tablet Take 0.5 mg by mouth daily. 11/25/19   [provider]  medroxyPROGESTERone (PROVERA) 2.5 MG tablet Take 2.5 mg by mouth daily. 11/10/21   [provider]  medroxyPROGESTERone (PROVERA) 5 MG tablet Take 5 mg by mouth daily.    [provider]  rOPINIRole HCl (REQUIP PO) Take by mouth at bedtime.    [provider]  rosuvastatin (CRESTOR) 10 MG tablet Take 10 mg by mouth at bedtime. 09/02/21   [provider]  traMADol (ULTRAM) 50 MG tablet Take 50 mg by mouth daily as needed. 10/14/21   [provider]    Family History Family History  Problem Relation Age of Onset   Diabetes Mother    Heart disease Mother    Hyperlipidemia Mother    Breast cancer Neg Hx     Social History Social History   Tobacco Use   Smoking status: Never   Smokeless tobacco: Never  Vaping Use  Vaping Use: Never used  Substance Use Topics   Alcohol use: Yes    Comment: 2 glasses wine a month   Drug use: No     Allergies   Patient has no known allergies.   Review of Systems Review of Systems  Constitutional:  Negative for chills and fever.  Eyes:  Negative for discharge and redness.  Respiratory:  Negative for shortness of breath.   Cardiovascular:  Positive for chest pain.  Gastrointestinal:  Negative for nausea and vomiting.  Skin:  Negative for color change and wound.     Physical Exam Triage Vital Signs ED Triage Vitals  Enc Vitals Group     BP 06/17/22 2030 136/87     Pulse Rate 06/17/22 2030 76     Resp 06/17/22 2030 16     Temp 06/17/22 2030 98.3 F (36.8 C)     Temp Source 06/17/22 2030 Oral     SpO2 06/17/22 2030 98 %     Weight --       Height --      Head Circumference --      Peak Flow --      Pain Score 06/17/22 2031 8     Pain Loc --      Pain Edu? --      Excl. in [place]? --    No data found.  Updated Vital Signs BP 136/87 (BP Location: Left Arm)   Pulse 76   Temp 98.3 F (36.8 C) (Oral)   Resp 16   SpO2 98%      Physical Exam Vitals and nursing note reviewed.  Constitutional:      General: She is not in acute distress.    Appearance: Normal appearance. She is not ill-appearing.  HENT:     Head: Normocephalic and atraumatic.  Eyes:     Conjunctiva/sclera: Conjunctivae normal.  Cardiovascular:     Rate and Rhythm: Normal rate.  Pulmonary:     Effort: Pulmonary effort is normal. No respiratory distress.  Chest:     Chest wall: Tenderness present.  Musculoskeletal:     Comments: Mild TTP noted to right chest wall anteriorly- superior to breast  Neurological:     Mental Status: She is alert.  Psychiatric:        Mood and Affect: Mood normal.        Behavior: Behavior normal.      UC Treatments / Results  Labs (all labs ordered are listed, but only abnormal results are displayed) Labs Reviewed - No data to display  EKG   Radiology No results found.  Procedures Procedures (including critical care time)  Medications Ordered in UC Medications - No data to display  Initial Impression / Assessment and Plan / UC Course  I have reviewed the triage vital signs and the nursing notes.  Pertinent labs & imaging results that were available during my care of the patient were reviewed by me and considered in my medical decision making (see chart for details).   Suspect muscle strain given worsening pain with movement and tenderness on exam.  Recommend ibuprofen to help with pain and inflammation. Discussed steroid treatment and patient prefers injection if possible- solumedrol administered in office. Encouraged follow-up with PCP if no gradual improvement or with any further concerns she may need  mammogram sooner..  Patient expresses understanding.  Final Clinical Impressions(s) / UC Diagnoses   Final diagnoses:  Chest wall pain   Discharge Instructions   None  ED Prescriptions   None    PDMP not reviewed this encounter.   Tomi Bamberger, PA-C 06/19/22 0853    Tomi Bamberger, PA-C 06/19/22 [phone]

## 2022-07-15 DIAGNOSIS — Z7689 Persons encountering health services in other specified circumstances: Secondary | ICD-10-CM | POA: Diagnosis not present

## 2022-07-15 DIAGNOSIS — M461 Sacroiliitis, not elsewhere classified: Secondary | ICD-10-CM | POA: Diagnosis not present

## 2022-07-15 DIAGNOSIS — Z0000 Encounter for general adult medical examination without abnormal findings: Secondary | ICD-10-CM | POA: Diagnosis not present

## 2022-07-15 DIAGNOSIS — Z111 Encounter for screening for respiratory tuberculosis: Secondary | ICD-10-CM | POA: Diagnosis not present

## 2022-07-15 DIAGNOSIS — Z139 Encounter for screening, unspecified: Secondary | ICD-10-CM | POA: Diagnosis not present

## 2022-07-29 DIAGNOSIS — Z6827 Body mass index (BMI) 27.0-27.9, adult: Secondary | ICD-10-CM | POA: Diagnosis not present

## 2022-07-29 DIAGNOSIS — Z1231 Encounter for screening mammogram for malignant neoplasm of breast: Secondary | ICD-10-CM | POA: Diagnosis not present

## 2022-07-29 DIAGNOSIS — Z01419 Encounter for gynecological examination (general) (routine) without abnormal findings: Secondary | ICD-10-CM | POA: Diagnosis not present

## 2022-07-30 ENCOUNTER — Encounter: Payer: Self-pay | Admitting: Internal Medicine

## 2022-07-31 ENCOUNTER — Other Ambulatory Visit: Payer: Self-pay | Admitting: Obstetrics & Gynecology

## 2022-07-31 DIAGNOSIS — G4733 Obstructive sleep apnea (adult) (pediatric): Secondary | ICD-10-CM | POA: Diagnosis not present

## 2022-07-31 DIAGNOSIS — R928 Other abnormal and inconclusive findings on diagnostic imaging of breast: Secondary | ICD-10-CM

## 2022-08-07 ENCOUNTER — Other Ambulatory Visit (HOSPITAL_COMMUNITY): Payer: Self-pay | Admitting: Obstetrics & Gynecology

## 2022-08-07 DIAGNOSIS — N6320 Unspecified lump in the left breast, unspecified quadrant: Secondary | ICD-10-CM

## 2022-08-13 ENCOUNTER — Ambulatory Visit (HOSPITAL_COMMUNITY)
Admission: RE | Admit: 2022-08-13 | Discharge: 2022-08-13 | Disposition: A | Discharge status: Home / Self Care | Payer: Federal, State, Local not specified - PPO | Source: Ambulatory Visit | Attending: Obstetrics & Gynecology | Admitting: Obstetrics & Gynecology

## 2022-08-13 DIAGNOSIS — N6320 Unspecified lump in the left breast, unspecified quadrant: Secondary | ICD-10-CM | POA: Diagnosis not present

## 2022-08-13 DIAGNOSIS — N6002 Solitary cyst of left breast: Secondary | ICD-10-CM | POA: Diagnosis not present

## 2022-08-20 ENCOUNTER — Ambulatory Visit (HOSPITAL_COMMUNITY): Payer: Federal, State, Local not specified - PPO

## 2022-08-25 NOTE — Progress Notes (Unsigned)
GI Office Note    Referring Provider: Vania Rea, MD Primary Care Physician:  Vania Rea, MD  Primary Gastroenterologist: Cristopher Estimable.Rourk, MD  Chief Complaint   No chief complaint on file.   History of Present Illness   Heather Davis is a 57 y.o. female presenting today at the request of Vania Rea, MD for ***diarrhea.  Per referral paperwork patient reported weekly diarrhea. Due for colonoscopy 2025.   Diarrhea Patient complains of diarrhea. Symptoms have been present for approximately {0-10:33138} {time units:11}. The symptoms are {course:17}. Stool frequency is approximately {numbers 0-10:33138} per {time units:11}. Patient estimates stool volume to be {diar amt:13185}. Diarrhea {does/do/not:33181} occur at night. The patient has noted {bleeding with WC:58527}. The also patient reports the following symptoms: {chr diarrhea sx:13184}. The patient denies the following symptoms: {chr diarrhea sx:13184}. {abd pain:17887}.   Relationship to food: {chr diarrhea food:13186}. Relationship to medications: {chr diarrhea meds:13188}. Other risk factors: {chr diarrhea risks:13189}. Therapy tried so far: {chr diarrhea home tx:13190}. Work up so far: {inflam bowel wu:13191}.    Current Outpatient Medications  Medication Sig Dispense Refill   ALPRAZolam (XANAX) 0.25 MG tablet Take 0.25 mg by mouth daily as needed.     celecoxib (CELEBREX) 200 MG capsule Take 200 mg by mouth daily.     citalopram (CELEXA) 40 MG tablet Take 40 mg by mouth.      estradiol (ESTRACE) 0.5 MG tablet Take 0.5 mg by mouth daily.     medroxyPROGESTERone (PROVERA) 2.5 MG tablet Take 2.5 mg by mouth daily.     medroxyPROGESTERone (PROVERA) 5 MG tablet Take 5 mg by mouth daily.     rOPINIRole HCl (REQUIP PO) Take by mouth at bedtime.     rosuvastatin (CRESTOR) 10 MG tablet Take 10 mg by mouth at bedtime.     traMADol (ULTRAM) 50 MG tablet Take 50 mg by mouth daily as needed.     No current facility-administered  medications for this visit.    Past Medical History:  Diagnosis Date   Anxiety    Complication of anesthesia    Left wrist fracture    Major depression in remission (HCC)    PONV (postoperative nausea and vomiting)    Scoliosis    Sleep apnea    mouth guard nightly   Varicose veins     Past Surgical History:  Procedure Laterality Date   APPENDECTOMY     BACK SURGERY  09/2015   CERVICAL FUSION     CESAREAN SECTION     CHOLECYSTECTOMY     ORIF WRIST FRACTURE Left 06/08/2020   Procedure: OPEN REDUCTION INTERNAL FIXATION (ORIF) WRIST FRACTURE;  Surgeon: Hiram Gash, MD;  Location: [place];  Service: Orthopedics;  Laterality: Left;   PANNICULECTOMY      Family History  Problem Relation Age of Onset   Diabetes Mother    Heart disease Mother    Hyperlipidemia Mother    Breast cancer Neg Hx     Allergies as of 08/26/2022   (No Known Allergies)    Social History   Socioeconomic History   Marital status: Divorced    Spouse name: Not on file   Number of children: 2   Years of education: college   Highest education level: Not on file  Occupational History   Not on file  Tobacco Use   Smoking status: Never   Smokeless tobacco: Never  Vaping Use   Vaping Use: Never used  Substance and Sexual Activity  Alcohol use: Yes    Comment: 2 glasses wine a month   Drug use: No   Sexual activity: Not Currently    Birth control/protection: Post-menopausal  Other Topics Concern   Not on file  Social History Narrative   Drinks about 2 caffeine drinks a day    Social Determinants of Health   Financial Resource Strain: Not on file  Food Insecurity: Not on file  Transportation Needs: Not on file  Physical Activity: Not on file  Stress: Not on file  Social Connections: Not on file  Intimate Partner Violence: Not on file     Review of Systems   Gen: Denies any fever, chills, fatigue, weight loss, lack of appetite.  CV: Denies chest pain, heart  palpitations, peripheral edema, syncope.  Resp: Denies shortness of breath at rest or with exertion. Denies wheezing or cough.  GI: see HPI GU : Denies urinary burning, urinary frequency, urinary hesitancy MS: Denies joint pain, muscle weakness, cramps, or limitation of movement.  Derm: Denies rash, itching, dry skin Psych: Denies depression, anxiety, memory loss, and confusion Heme: Denies bruising, bleeding, and enlarged lymph nodes.   Physical Exam   There were no vitals taken for this visit.  General:   Alert and oriented. Pleasant and cooperative. Well-nourished and well-developed.  Head:  Normocephalic and atraumatic. Eyes:  Without icterus, sclera clear and conjunctiva pink.  Ears:  Normal auditory acuity. Mouth:  No deformity or lesions, oral mucosa pink.  Lungs:  Clear to auscultation bilaterally. No wheezes, rales, or rhonchi. No distress.  Heart:  S1, S2 present without murmurs appreciated.  Abdomen:  +BS, soft, non-tender and non-distended. No HSM noted. No guarding or rebound. No masses appreciated.  Rectal:  Deferred *** Msk:  Symmetrical without gross deformities. Normal posture. Extremities:  Without edema. Neurologic:  Alert and  oriented x4;  grossly normal neurologically. Skin:  Intact without significant lesions or rashes. Psych:  Alert and cooperative. Normal mood and affect.   Assessment   Heather Davis is a 57 y.o. female with a history of anxiety, depression, HLD*** presenting today for evaluation of diarrhea.   Diarrhea:   PLAN   *** CBC, CMP, TSH, Celiac serologies Stool studies?    Brooke Bonito, MSN, FNP-BC, AGACNP-BC [facility]

## 2022-08-26 ENCOUNTER — Ambulatory Visit: Payer: Federal, State, Local not specified - PPO | Admitting: Gastroenterology

## 2022-08-26 ENCOUNTER — Ambulatory Visit (INDEPENDENT_AMBULATORY_CARE_PROVIDER_SITE_OTHER): Payer: Federal, State, Local not specified - PPO | Admitting: Gastroenterology

## 2022-08-26 ENCOUNTER — Encounter: Payer: Self-pay | Admitting: Gastroenterology

## 2022-08-26 VITALS — BP 118/77 | HR 88 | Temp 98.1°F | Ht 68.0 in | Wt 184.6 lb

## 2022-08-26 DIAGNOSIS — Z1211 Encounter for screening for malignant neoplasm of colon: Secondary | ICD-10-CM | POA: Diagnosis not present

## 2022-08-26 DIAGNOSIS — R197 Diarrhea, unspecified: Secondary | ICD-10-CM | POA: Diagnosis not present

## 2022-08-26 NOTE — Patient Instructions (Addendum)
Note to be taking Benefiber 2-3 teaspoons daily.  May increase to twice daily after 2 to 3 weeks.  It may take 3 to 4 weeks before you see a significant difference in bowel movements.  Also slight little bit of extra bloating may occur in the beginning.  As we discussed I like for you to keep a stool consistency/symptom log to see if we can find trends/patterns in your bowel habits and any possible triggers.  To ensure this is more diarrhea not constipation with overflow diarrhea I would like for you to try to avoid as much lactose/dairy products as possible as this commonly causes diarrhea and bloating.  I have attached a handout regarding IBS which includes information about predominant diarrhea and predominant constipation.  I am ordering labs for you have completed at [facility] at your earliest convenience.  This is to rule out celiac/gluten sensitivity as cause of possible diarrhea.  See for follow-up in about 3 months, or sooner if needed.  It was a pleasure to see you today. I want to create trusting relationships with patients. If you receive a survey regarding your visit,  I greatly appreciate you taking time to fill this out on paper or through your MyChart. I value your feedback.  Venetia Night, MSN, FNP-BC, AGACNP-BC [facility]

## 2022-08-27 LAB — IGA: IgA/Immunoglobulin A, Serum: 171 mg/dL (ref 87–352)

## 2022-08-27 LAB — TISSUE TRANSGLUTAMINASE, IGA: Transglutaminase IgA: <2 U/mL (ref 0–3)

## 2022-09-10 ENCOUNTER — Ambulatory Visit: Payer: Federal, State, Local not specified - PPO | Admitting: Gastroenterology

## 2022-09-10 ENCOUNTER — Other Ambulatory Visit: Payer: Federal, State, Local not specified - PPO

## 2022-11-20 ENCOUNTER — Ambulatory Visit (HOSPITAL_COMMUNITY): Payer: Federal, State, Local not specified - PPO

## 2022-11-25 ENCOUNTER — Ambulatory Visit: Payer: Federal, State, Local not specified - PPO | Admitting: Gastroenterology

## 2022-11-27 ENCOUNTER — Ambulatory Visit: Payer: 59 | Admitting: Urology

## 2022-12-04 DIAGNOSIS — M47816 Spondylosis without myelopathy or radiculopathy, lumbar region: Secondary | ICD-10-CM | POA: Diagnosis not present

## 2022-12-04 DIAGNOSIS — M546 Pain in thoracic spine: Secondary | ICD-10-CM | POA: Diagnosis not present

## 2022-12-04 DIAGNOSIS — M4325 Fusion of spine, thoracolumbar region: Secondary | ICD-10-CM | POA: Diagnosis not present

## 2022-12-12 DIAGNOSIS — G4733 Obstructive sleep apnea (adult) (pediatric): Secondary | ICD-10-CM | POA: Diagnosis not present

## 2022-12-17 DIAGNOSIS — L820 Inflamed seborrheic keratosis: Secondary | ICD-10-CM | POA: Diagnosis not present

## 2022-12-17 DIAGNOSIS — X32XXXD Exposure to sunlight, subsequent encounter: Secondary | ICD-10-CM | POA: Diagnosis not present

## 2022-12-17 DIAGNOSIS — L570 Actinic keratosis: Secondary | ICD-10-CM | POA: Diagnosis not present

## 2022-12-17 DIAGNOSIS — L821 Other seborrheic keratosis: Secondary | ICD-10-CM | POA: Diagnosis not present

## 2022-12-23 DIAGNOSIS — Z01818 Encounter for other preprocedural examination: Secondary | ICD-10-CM | POA: Diagnosis not present

## 2022-12-23 DIAGNOSIS — Z01810 Encounter for preprocedural cardiovascular examination: Secondary | ICD-10-CM | POA: Diagnosis not present

## 2022-12-27 DIAGNOSIS — Z6827 Body mass index (BMI) 27.0-27.9, adult: Secondary | ICD-10-CM | POA: Diagnosis not present

## 2022-12-27 DIAGNOSIS — M4322 Fusion of spine, cervical region: Secondary | ICD-10-CM | POA: Diagnosis not present

## 2022-12-27 DIAGNOSIS — Z79899 Other long term (current) drug therapy: Secondary | ICD-10-CM | POA: Diagnosis not present

## 2022-12-27 DIAGNOSIS — R0989 Other specified symptoms and signs involving the circulatory and respiratory systems: Secondary | ICD-10-CM | POA: Diagnosis not present

## 2022-12-27 DIAGNOSIS — G4733 Obstructive sleep apnea (adult) (pediatric): Secondary | ICD-10-CM | POA: Diagnosis not present

## 2023-01-07 DIAGNOSIS — G4733 Obstructive sleep apnea (adult) (pediatric): Secondary | ICD-10-CM | POA: Diagnosis not present

## 2023-01-22 DIAGNOSIS — D649 Anemia, unspecified: Secondary | ICD-10-CM | POA: Diagnosis not present

## 2023-01-22 DIAGNOSIS — E785 Hyperlipidemia, unspecified: Secondary | ICD-10-CM | POA: Diagnosis not present

## 2023-01-29 DIAGNOSIS — Z0000 Encounter for general adult medical examination without abnormal findings: Secondary | ICD-10-CM | POA: Diagnosis not present

## 2023-01-29 DIAGNOSIS — F325 Major depressive disorder, single episode, in full remission: Secondary | ICD-10-CM | POA: Diagnosis not present

## 2023-01-29 DIAGNOSIS — Z1331 Encounter for screening for depression: Secondary | ICD-10-CM | POA: Diagnosis not present

## 2023-01-29 DIAGNOSIS — G4733 Obstructive sleep apnea (adult) (pediatric): Secondary | ICD-10-CM | POA: Diagnosis not present

## 2023-01-29 DIAGNOSIS — Z1339 Encounter for screening examination for other mental health and behavioral disorders: Secondary | ICD-10-CM | POA: Diagnosis not present

## 2023-01-29 DIAGNOSIS — O24419 Gestational diabetes mellitus in pregnancy, unspecified control: Secondary | ICD-10-CM | POA: Diagnosis not present

## 2023-01-29 DIAGNOSIS — Z8632 Personal history of gestational diabetes: Secondary | ICD-10-CM | POA: Diagnosis not present

## 2023-01-30 ENCOUNTER — Encounter: Payer: Self-pay | Admitting: *Deleted

## 2023-01-30 DIAGNOSIS — G4733 Obstructive sleep apnea (adult) (pediatric): Secondary | ICD-10-CM | POA: Diagnosis not present

## 2023-02-05 DIAGNOSIS — M47816 Spondylosis without myelopathy or radiculopathy, lumbar region: Secondary | ICD-10-CM | POA: Diagnosis not present

## 2023-02-25 DIAGNOSIS — M2560 Stiffness of unspecified joint, not elsewhere classified: Secondary | ICD-10-CM | POA: Diagnosis not present

## 2023-02-25 DIAGNOSIS — M545 Low back pain, unspecified: Secondary | ICD-10-CM | POA: Diagnosis not present

## 2023-02-25 DIAGNOSIS — R293 Abnormal posture: Secondary | ICD-10-CM | POA: Diagnosis not present

## 2023-02-27 DIAGNOSIS — G4733 Obstructive sleep apnea (adult) (pediatric): Secondary | ICD-10-CM | POA: Diagnosis not present

## 2023-02-27 DIAGNOSIS — E782 Mixed hyperlipidemia: Secondary | ICD-10-CM | POA: Diagnosis not present

## 2023-03-03 DIAGNOSIS — M2560 Stiffness of unspecified joint, not elsewhere classified: Secondary | ICD-10-CM | POA: Diagnosis not present

## 2023-03-03 DIAGNOSIS — M545 Low back pain, unspecified: Secondary | ICD-10-CM | POA: Diagnosis not present

## 2023-03-03 DIAGNOSIS — R293 Abnormal posture: Secondary | ICD-10-CM | POA: Diagnosis not present

## 2023-03-06 DIAGNOSIS — R293 Abnormal posture: Secondary | ICD-10-CM | POA: Diagnosis not present

## 2023-03-06 DIAGNOSIS — M2560 Stiffness of unspecified joint, not elsewhere classified: Secondary | ICD-10-CM | POA: Diagnosis not present

## 2023-03-06 DIAGNOSIS — M545 Low back pain, unspecified: Secondary | ICD-10-CM | POA: Diagnosis not present

## 2023-03-10 DIAGNOSIS — H9201 Otalgia, right ear: Secondary | ICD-10-CM | POA: Diagnosis not present

## 2023-03-10 DIAGNOSIS — M545 Low back pain, unspecified: Secondary | ICD-10-CM | POA: Diagnosis not present

## 2023-03-10 DIAGNOSIS — R293 Abnormal posture: Secondary | ICD-10-CM | POA: Diagnosis not present

## 2023-03-10 DIAGNOSIS — M2560 Stiffness of unspecified joint, not elsewhere classified: Secondary | ICD-10-CM | POA: Diagnosis not present

## 2023-03-13 DIAGNOSIS — R293 Abnormal posture: Secondary | ICD-10-CM | POA: Diagnosis not present

## 2023-03-13 DIAGNOSIS — M545 Low back pain, unspecified: Secondary | ICD-10-CM | POA: Diagnosis not present

## 2023-03-13 DIAGNOSIS — M2560 Stiffness of unspecified joint, not elsewhere classified: Secondary | ICD-10-CM | POA: Diagnosis not present

## 2023-03-17 DIAGNOSIS — M2560 Stiffness of unspecified joint, not elsewhere classified: Secondary | ICD-10-CM | POA: Diagnosis not present

## 2023-03-17 DIAGNOSIS — R293 Abnormal posture: Secondary | ICD-10-CM | POA: Diagnosis not present

## 2023-03-17 DIAGNOSIS — M545 Low back pain, unspecified: Secondary | ICD-10-CM | POA: Diagnosis not present

## 2023-03-19 DIAGNOSIS — M2560 Stiffness of unspecified joint, not elsewhere classified: Secondary | ICD-10-CM | POA: Diagnosis not present

## 2023-03-19 DIAGNOSIS — M545 Low back pain, unspecified: Secondary | ICD-10-CM | POA: Diagnosis not present

## 2023-03-19 DIAGNOSIS — R293 Abnormal posture: Secondary | ICD-10-CM | POA: Diagnosis not present

## 2023-03-20 DIAGNOSIS — G4733 Obstructive sleep apnea (adult) (pediatric): Secondary | ICD-10-CM | POA: Diagnosis not present

## 2023-03-20 DIAGNOSIS — E782 Mixed hyperlipidemia: Secondary | ICD-10-CM | POA: Diagnosis not present

## 2023-03-21 ENCOUNTER — Other Ambulatory Visit (HOSPITAL_BASED_OUTPATIENT_CLINIC_OR_DEPARTMENT_OTHER): Payer: Self-pay | Admitting: Family Medicine

## 2023-03-21 DIAGNOSIS — G4733 Obstructive sleep apnea (adult) (pediatric): Secondary | ICD-10-CM

## 2023-04-04 ENCOUNTER — Ambulatory Visit (HOSPITAL_BASED_OUTPATIENT_CLINIC_OR_DEPARTMENT_OTHER)
Admission: RE | Admit: 2023-04-04 | Discharge: 2023-04-04 | Disposition: A | Discharge status: Home / Self Care | Payer: Federal, State, Local not specified - PPO | Source: Ambulatory Visit | Attending: Family Medicine | Admitting: Family Medicine

## 2023-04-04 DIAGNOSIS — G4733 Obstructive sleep apnea (adult) (pediatric): Secondary | ICD-10-CM | POA: Insufficient documentation

## 2023-04-04 DIAGNOSIS — G4730 Sleep apnea, unspecified: Secondary | ICD-10-CM | POA: Diagnosis not present

## 2023-04-10 DIAGNOSIS — H6993 Unspecified Eustachian tube disorder, bilateral: Secondary | ICD-10-CM | POA: Diagnosis not present

## 2023-04-10 DIAGNOSIS — G4733 Obstructive sleep apnea (adult) (pediatric): Secondary | ICD-10-CM | POA: Diagnosis not present

## 2023-04-10 DIAGNOSIS — M2669 Other specified disorders of temporomandibular joint: Secondary | ICD-10-CM | POA: Diagnosis not present

## 2023-04-10 DIAGNOSIS — M26609 Unspecified temporomandibular joint disorder, unspecified side: Secondary | ICD-10-CM | POA: Diagnosis not present

## 2023-04-23 DIAGNOSIS — M2669 Other specified disorders of temporomandibular joint: Secondary | ICD-10-CM | POA: Diagnosis not present

## 2023-04-23 DIAGNOSIS — M7911 Myalgia of mastication muscle: Secondary | ICD-10-CM | POA: Diagnosis not present

## 2023-05-06 DIAGNOSIS — G4733 Obstructive sleep apnea (adult) (pediatric): Secondary | ICD-10-CM | POA: Diagnosis not present

## 2023-05-06 DIAGNOSIS — E782 Mixed hyperlipidemia: Secondary | ICD-10-CM | POA: Diagnosis not present

## 2023-05-09 DIAGNOSIS — T85123A Displacement of implanted electronic neurostimulator, generator, initial encounter: Secondary | ICD-10-CM | POA: Diagnosis not present

## 2023-05-09 DIAGNOSIS — Z79899 Other long term (current) drug therapy: Secondary | ICD-10-CM | POA: Diagnosis not present

## 2023-05-09 DIAGNOSIS — Z4542 Encounter for adjustment and management of neuropacemaker (brain) (peripheral nerve) (spinal cord): Secondary | ICD-10-CM | POA: Diagnosis not present

## 2023-05-09 DIAGNOSIS — Y752 Prosthetic and other implants, materials and neurological devices associated with adverse incidents: Secondary | ICD-10-CM | POA: Diagnosis not present

## 2023-05-09 DIAGNOSIS — T85840A Pain due to nervous system prosthetic devices, implants and grafts, initial encounter: Secondary | ICD-10-CM | POA: Diagnosis not present

## 2023-05-09 DIAGNOSIS — Z6827 Body mass index (BMI) 27.0-27.9, adult: Secondary | ICD-10-CM | POA: Diagnosis not present

## 2023-05-09 DIAGNOSIS — G4733 Obstructive sleep apnea (adult) (pediatric): Secondary | ICD-10-CM | POA: Diagnosis not present

## 2023-05-21 DIAGNOSIS — G4733 Obstructive sleep apnea (adult) (pediatric): Secondary | ICD-10-CM | POA: Diagnosis not present

## 2023-05-22 DIAGNOSIS — G4733 Obstructive sleep apnea (adult) (pediatric): Secondary | ICD-10-CM | POA: Diagnosis not present

## 2023-05-27 ENCOUNTER — Telehealth: Payer: Self-pay | Admitting: Internal Medicine

## 2023-05-27 NOTE — Telephone Encounter (Signed)
  Procedure: Colonoscopy  Height: 5'8 Weight: 178lbs        Have you had a colonoscopy before?  05/2008 at [facility]  Do you have family history of colon cancer?  no  Do you have a family history of polyps? no  Previous colonoscopy with polyps removed? no  Do you have a history colorectal cancer?   no  Are you diabetic?  no  Do you have a prosthetic or mechanical heart valve? no  Do you have a pacemaker/defibrillator?   Pacemaker inspire  Have you had endocarditis/atrial fibrillation?  no  Do you use supplemental oxygen/CPAP?  no  Have you had joint replacement within the last 12 months?  no  Do you tend to be constipated or have to use laxatives?  no   Do you have history of alcohol use? If yes, how much and how often.  Yes 4 per week  Do you have history or are you using drugs? If yes, what do are you  using?  no  Have you ever had a stroke/heart attack?  no  Have you ever had a heart or other vascular stent placed,?no  Do you take weight loss medication? no  female patients,: have you had a hysterectomy? no                              are you post menopausal?  yes                              do you still have your menstrual cycle? no    Date of last menstrual period?   Do you take any blood-thinning medications such as: (Plavix, aspirin, Coumadin, Aggrenox, Brilinta, Xarelto, Eliquis, Pradaxa, Savaysa or Effient)? no  If yes we need the name, milligram, dosage and who is prescribing doctor:               Current Outpatient Medications  Medication Sig Dispense Refill   ALPRAZolam (XANAX) 0.25 MG tablet Take 0.25 mg by mouth daily as needed.     celecoxib (CELEBREX) 200 MG capsule Take 200 mg by mouth daily.     citalopram (CELEXA) 40 MG tablet Take 40 mg by mouth.      estradiol (ESTRACE) 0.5 MG tablet Take 0.5 mg by mouth daily.     medroxyPROGESTERone (PROVERA) 2.5 MG tablet Take 2.5 mg by mouth daily.     modafinil (PROVIGIL) 200 MG tablet Take 200 mg by mouth  daily.     rOPINIRole HCl (REQUIP PO) Take by mouth at bedtime.     rosuvastatin (CRESTOR) 10 MG tablet Take 10 mg by mouth at bedtime.     traMADol (ULTRAM) 50 MG tablet Take 50 mg by mouth daily as needed.     No current facility-administered medications for this visit.    No Known Allergies

## 2023-05-27 NOTE — Telephone Encounter (Signed)
Questionnaire in review

## 2023-05-27 NOTE — Addendum Note (Signed)
Addended by: Armstead Peaks on: 05/27/2023 04:22 PM   Modules accepted: Orders

## 2023-06-04 DIAGNOSIS — M2669 Other specified disorders of temporomandibular joint: Secondary | ICD-10-CM | POA: Diagnosis not present

## 2023-06-04 DIAGNOSIS — M7911 Myalgia of mastication muscle: Secondary | ICD-10-CM | POA: Diagnosis not present

## 2023-06-18 NOTE — Telephone Encounter (Signed)
Recommend ov with Toni Amend who saw her in 08/2022 for bowel issues. Also with reported negative cologuard last year. Inspire/pacemaker marked on questionnaire?

## 2023-06-24 ENCOUNTER — Encounter: Payer: Self-pay | Admitting: Gastroenterology

## 2023-06-24 ENCOUNTER — Ambulatory Visit: Payer: Federal, State, Local not specified - PPO | Admitting: Gastroenterology

## 2023-06-24 VITALS — BP 129/77 | HR 77 | Temp 98.0°F | Ht 68.0 in | Wt 180.0 lb

## 2023-06-24 DIAGNOSIS — Z1211 Encounter for screening for malignant neoplasm of colon: Secondary | ICD-10-CM

## 2023-06-24 DIAGNOSIS — K5900 Constipation, unspecified: Secondary | ICD-10-CM | POA: Diagnosis not present

## 2023-06-24 NOTE — Progress Notes (Signed)
GI Office Note    Referring Provider: Alysia Penna, MD Primary Care Physician:  Alysia Penna, MD Primary Gastroenterologist: Gerrit Friends.Rourk, MD  Date:  06/24/2023  ID:  Heather Davis, DOB May 30, 1966, MRN [id number]   Chief Complaint   Chief Complaint  Patient presents with   Colonoscopy    Colonoscopy screening    History of Present Illness  Heather Davis is a 57 y.o. female with a history of anxiety/depression, HLD, and chronic intermittent diarrhea presenting today for evaluation prior to scheduling screening colonoscopy.  Colonoscopy performed in 2008/2009 that occurred after appendectomy. Had cdiff in 2008/2009.  Has previously denied family history of celiac disease , UC or chron's.   Ileocolonoscopy December 2009: -Diffuse colitis beginning in the proximal rectum extending to the cecum with inflammatory changes most pronounced in the sigmoid and proximal rectum -Pseudomembranes seen from proximal rectum to transverse colon s/p biopsies -Normal TI (suspected diffuse colitis likely secondary to partially treated C. Difficile) -Advised to treat with probiotic, avoid NSAIDs, and start Flagyl.  Last office visit 08/26/2022.  Patient reported intermittent diarrhea for 6 months to a couple of years but having slightly more frequent symptoms.  Sometimes having diarrhea at night and also reported bloating, alternation with constipation, urgency, and flatulence.  Has abdominal cramping as well.  Denied any relationship to food.  Has good results with over-the-counter antidiarrheal.  Reported several food intolerances including nuts and fish that caused her diarrhea and vomiting.  Has tried Gas-X but no antacids as she denied any reflux.  Admitted to having to strain symptoms prior to episodes.  Alternates between [facility] 4 stools and then a day of diarrhea.  Had completed a Cologuard a year prior.  Denied any family history of colon cancer or colon polyps.  Advised to check celiac  serologies, start fiber supplement as well as MiraLAX every other day and keep a stool/symptom log.  Encouraged to continue to avoid known food sensitivities and IBS education provided.  Advised she would need colonoscopy in 3 to 6 months if unable to control bowel habits.  Colonoscopy questionnaire sent in on 10/8.  On questionnaire she reported " pacemaker inspire" but denied any A-fib, OSA with CPAP.  Noted 4 alcoholic drinks per week, no other history of drug use.  Denied any issues with heart attack or stroke.  Notes she is postmenopausal and not on any blood thinners.  Today:  Had an implant for sleep apnea. It stimulates her tongue to move forward so her airway stays open. She had tried to CPAP mask previously and that was keeping her awake at night due to tossing and turning. Even had tonsils removed last year.   Has a true intolerance to nuts and had diarrhea after. Sometimes it makes her vomit. After this happens she does not have a BM for about 4 days. Trying to drink more water.   About once a month has a cycle of diarrhea and no BM for a few days and then almost daily BM. Only has abdominal pain with food intolerance. No melena or brbpr.   2 years ago she did Cologuard.    Current Outpatient Medications  Medication Sig Dispense Refill   ALPRAZolam (XANAX) 0.25 MG tablet Take 0.25 mg by mouth daily as needed.     celecoxib (CELEBREX) 200 MG capsule Take 200 mg by mouth daily.     citalopram (CELEXA) 40 MG tablet Take 40 mg by mouth.      estradiol (ESTRACE) 0.5 MG  tablet Take 0.5 mg by mouth daily.     medroxyPROGESTERone (PROVERA) 2.5 MG tablet Take 2.5 mg by mouth daily.     modafinil (PROVIGIL) 200 MG tablet Take 200 mg by mouth daily.     rOPINIRole HCl (REQUIP PO) Take by mouth at bedtime.     rosuvastatin (CRESTOR) 10 MG tablet Take 10 mg by mouth at bedtime.     traMADol (ULTRAM) 50 MG tablet Take 50 mg by mouth daily as needed. (Patient not taking: Reported on 06/24/2023)      No current facility-administered medications for this visit.    Past Medical History:  Diagnosis Date   Anxiety    Complication of anesthesia    Left wrist fracture    Major depression in remission (HCC)    PONV (postoperative nausea and vomiting)    Scoliosis    Sleep apnea    mouth guard nightly   Varicose veins     Past Surgical History:  Procedure Laterality Date   APPENDECTOMY     BACK SURGERY  09/2015   CERVICAL FUSION     CESAREAN SECTION     CHOLECYSTECTOMY     ORIF WRIST FRACTURE Left 06/08/2020   Procedure: OPEN REDUCTION INTERNAL FIXATION (ORIF) WRIST FRACTURE;  Surgeon: Bjorn Pippin, MD;  Location: [facility];  Service: Orthopedics;  Laterality: Left;   PANNICULECTOMY      Family History  Problem Relation Age of Onset   Diabetes Mother    Heart disease Mother    Hyperlipidemia Mother    Breast cancer Neg Hx     Allergies as of 06/24/2023   (No Known Allergies)    Social History   Socioeconomic History   Marital status: Divorced    Spouse name: Not on file   Number of children: 2   Years of education: college   Highest education level: Not on file  Occupational History   Not on file  Tobacco Use   Smoking status: Never   Smokeless tobacco: Never  Vaping Use   Vaping status: Never Used  Substance and Sexual Activity   Alcohol use: Yes    Comment: 2 glasses wine a month   Drug use: No   Sexual activity: Not Currently    Birth control/protection: Post-menopausal  Other Topics Concern   Not on file  Social History Narrative   Drinks about 2 caffeine drinks a day    Social Determinants of Health   Financial Resource Strain: Not on file  Food Insecurity: Not on file  Transportation Needs: Not on file  Physical Activity: Not on file  Stress: Not on file  Social Connections: Not on file     Review of Systems   Gen: Denies fever, chills, anorexia. Denies fatigue, weakness, weight loss.  CV: Denies chest pain,  palpitations, syncope, peripheral edema, and claudication. Resp: Denies dyspnea at rest, cough, wheezing, coughing up blood, and pleurisy. GI: See HPI Derm: Denies rash, itching, dry skin Psych: Denies depression, anxiety, memory loss, confusion. No homicidal or suicidal ideation.  Heme: Denies bruising, bleeding, and enlarged lymph nodes.   Physical Exam   BP 129/77 (BP Location: Right Arm, Patient Position: Sitting, Cuff Size: Normal)   Pulse 77   Temp 98 F (36.7 C) (Temporal)   Ht 5\' 8"  (1.727 m)   Wt 180 lb (81.6 kg)   BMI 27.37 kg/m   General:   Alert and oriented. No distress noted. Pleasant and cooperative.  Head:  Normocephalic and  atraumatic. Eyes:  Conjuctiva clear without scleral icterus. Mouth:  Oral mucosa pink and moist. Good dentition. No lesions. Lungs:  Clear to auscultation bilaterally. No wheezes, rales, or rhonchi. No distress.  Heart:  S1, S2 present without murmurs appreciated.  Abdomen:  +BS, soft, non-tender and non-distended. No rebound or guarding. No HSM or masses noted. Rectal: deferred Msk:  Symmetrical without gross deformities. Normal posture. Extremities:  Without edema. Neurologic:  Alert and  oriented x4 Psych:  Alert and cooperative. Normal mood and affect.   Assessment  Heather Davis is a 57 y.o. female with a history of anxiety/depression, OSA s/p inspire implant and HLD presenting today for evaluation prior to scheduling screening colonoscopy.  Screening for colon cancer: Last colonoscopy in 2009 with diffuse C. difficile colitis and normal TI.  Negative Cologuard in December 2022.  Currently without any alarm symptoms.  Does have occasional constipation with overflow diarrhea.  Will proceed with scheduling screening colonoscopy.  Presumed constipation: Suspect that previous intermittent diarrhea was secondary to food intolerances as well as overflow in the setting of constipation.  Recently has been having a bowel movement almost daily,  about once a month she may have some overflow diarrhea and then no bowel movement for 3-4 days and then goes back to normal.  Suspect also that some of this is related to inadequate water intake.  Previous workup with normal TSH.  We will augment her prep with Linzess to ensure success.  PLAN   Proceed with colonoscopy with propofol by Dr. Jena Gauss in near future: the risks, benefits, and alternatives have been discussed with the patient in detail. The patient states understanding and desires to proceed. ASA 2 (inspire OSA implant) Linzess 145 mcg daily for 4 days prior Follow-up as needed    Brooke Bonito, MSN, FNP-BC, AGACNP-BC [facility]

## 2023-06-24 NOTE — Patient Instructions (Addendum)
We will get you scheduled for colonoscopy in the near future with Dr. Jena Gauss.   You will receive separate detailed written instructions regarding your prep.  It was a pleasure to see you today. I want to create trusting relationships with patients. If you receive a survey regarding your visit,  I greatly appreciate you taking time to fill this out on paper or through your MyChart. I value your feedback.  Brooke Bonito, MSN, FNP-BC, AGACNP-BC [facility]

## 2023-06-25 ENCOUNTER — Encounter: Payer: Self-pay | Admitting: *Deleted

## 2023-06-25 ENCOUNTER — Other Ambulatory Visit: Payer: Self-pay | Admitting: *Deleted

## 2023-06-25 MED ORDER — PEG 3350-KCL-NA BICARB-NACL 420 G PO SOLR: 4000.0000 mL | Freq: Once | ORAL | 0 refills | Status: AC

## 2023-07-16 DIAGNOSIS — Z0000 Encounter for general adult medical examination without abnormal findings: Secondary | ICD-10-CM | POA: Diagnosis not present

## 2023-07-16 DIAGNOSIS — M2669 Other specified disorders of temporomandibular joint: Secondary | ICD-10-CM | POA: Diagnosis not present

## 2023-08-06 ENCOUNTER — Other Ambulatory Visit: Payer: Self-pay | Admitting: Medical Genetics

## 2023-08-06 DIAGNOSIS — Z01419 Encounter for gynecological examination (general) (routine) without abnormal findings: Secondary | ICD-10-CM | POA: Diagnosis not present

## 2023-08-07 ENCOUNTER — Ambulatory Visit (HOSPITAL_COMMUNITY)
Admission: RE | Admit: 2023-08-07 | Discharge: 2023-08-07 | Disposition: A | Discharge status: Home / Self Care | Payer: Federal, State, Local not specified - PPO | Attending: Internal Medicine | Admitting: Internal Medicine

## 2023-08-07 ENCOUNTER — Ambulatory Visit (HOSPITAL_COMMUNITY): Payer: Federal, State, Local not specified - PPO | Admitting: Anesthesiology

## 2023-08-07 ENCOUNTER — Telehealth: Payer: Self-pay

## 2023-08-07 ENCOUNTER — Encounter (HOSPITAL_COMMUNITY)
Admission: RE | Disposition: A | Discharge status: Home / Self Care | Payer: Self-pay | Source: Home / Self Care | Attending: Internal Medicine

## 2023-08-07 ENCOUNTER — Other Ambulatory Visit: Payer: Self-pay

## 2023-08-07 ENCOUNTER — Encounter (HOSPITAL_COMMUNITY): Payer: Self-pay | Admitting: Internal Medicine

## 2023-08-07 DIAGNOSIS — I10 Essential (primary) hypertension: Secondary | ICD-10-CM | POA: Diagnosis not present

## 2023-08-07 DIAGNOSIS — G4730 Sleep apnea, unspecified: Secondary | ICD-10-CM | POA: Diagnosis not present

## 2023-08-07 DIAGNOSIS — K5909 Other constipation: Secondary | ICD-10-CM | POA: Diagnosis not present

## 2023-08-07 DIAGNOSIS — Z1211 Encounter for screening for malignant neoplasm of colon: Secondary | ICD-10-CM | POA: Diagnosis not present

## 2023-08-07 DIAGNOSIS — Q438 Other specified congenital malformations of intestine: Secondary | ICD-10-CM | POA: Diagnosis not present

## 2023-08-07 DIAGNOSIS — Z8619 Personal history of other infectious and parasitic diseases: Secondary | ICD-10-CM | POA: Diagnosis not present

## 2023-08-07 HISTORY — PX: COLONOSCOPY WITH PROPOFOL: SHX5780

## 2023-08-07 SURGERY — COLONOSCOPY WITH PROPOFOL
Anesthesia: General

## 2023-08-07 MED ORDER — LACTATED RINGERS IV SOLN: INTRAVENOUS | Status: DC

## 2023-08-07 MED ORDER — STERILE WATER FOR IRRIGATION IR SOLN
Status: DC | PRN
  Administered 2023-08-07: 30 mL

## 2023-08-07 MED ORDER — LINACLOTIDE 145 MCG PO CAPS: 145.0000 ug | ORAL_CAPSULE | Freq: Every day | ORAL | 11 refills | Status: AC

## 2023-08-07 MED ORDER — PROPOFOL 10 MG/ML IV BOLUS
INTRAVENOUS | Status: AC
  Filled 2023-08-07: qty 20

## 2023-08-07 MED ORDER — PROPOFOL 500 MG/50ML IV EMUL
INTRAVENOUS | Status: DC | PRN
  Administered 2023-08-07: 200 ug/kg/min via INTRAVENOUS

## 2023-08-07 NOTE — H&P (Signed)
@LOGO @   Primary Care Physician:  Alysia Penna, MD Primary Gastroenterologist:  Dr. Jena Gauss  Pre-Procedure History & Physical: HPI:  Heather Davis is a 57 y.o. female here for screening colonoscopy.  Distant history of C. difficile colitis.  Chronically constipated.  Linzess added to her regimen recently no family history colon cancer.  Just work great wants prescription.  Past Medical History:  Diagnosis Date   Anxiety    Complication of anesthesia    Left wrist fracture    Major depression in remission (HCC)    PONV (postoperative nausea and vomiting)    Scoliosis    Sleep apnea    mouth guard nightly   Varicose veins     Past Surgical History:  Procedure Laterality Date   APPENDECTOMY     BACK SURGERY  09/2015   CERVICAL FUSION     CESAREAN SECTION     CHOLECYSTECTOMY     ORIF WRIST FRACTURE Left 06/08/2020   Procedure: OPEN REDUCTION INTERNAL FIXATION (ORIF) WRIST FRACTURE;  Surgeon: Bjorn Pippin, MD;  Location: [facility];  Service: Orthopedics;  Laterality: Left;   PANNICULECTOMY      Prior to Admission medications   Medication Sig Start Date End Date Taking? Authorizing Provider  celecoxib (CELEBREX) 200 MG capsule Take 200 mg by mouth daily. 03/11/22  Yes [provider]  citalopram (CELEXA) 40 MG tablet Take 40 mg by mouth.  09/20/15  Yes [provider]  estradiol (ESTRACE) 0.5 MG tablet Take 0.5 mg by mouth daily. 11/25/19  Yes [provider]  medroxyPROGESTERone (PROVERA) 2.5 MG tablet Take 2.5 mg by mouth daily.   Yes [provider]  modafinil (PROVIGIL) 200 MG tablet Take 200 mg by mouth daily. 06/24/22  Yes [provider]  rOPINIRole HCl (REQUIP PO) Take by mouth at bedtime.   Yes [provider]  rosuvastatin (CRESTOR) 10 MG tablet Take 10 mg by mouth at bedtime. 09/02/21  Yes [provider]  ALPRAZolam Prudy Feeler) 0.25 MG tablet Take 0.25 mg by mouth daily as needed. 03/12/22    [provider]  traMADol (ULTRAM) 50 MG tablet Take 50 mg by mouth daily as needed. Patient not taking: Reported on 06/24/2023 10/14/21   [provider]    Allergies as of 06/25/2023   (No Known Allergies)    Family History  Problem Relation Age of Onset   Diabetes Mother    Heart disease Mother    Hyperlipidemia Mother    Breast cancer Neg Hx     Social History   Socioeconomic History   Marital status: Divorced    Spouse name: Not on file   Number of children: 2   Years of education: college   Highest education level: Not on file  Occupational History   Not on file  Tobacco Use   Smoking status: Never   Smokeless tobacco: Never  Vaping Use   Vaping status: Never Used  Substance and Sexual Activity   Alcohol use: Yes    Comment: 2 glasses wine a month   Drug use: No   Sexual activity: Not Currently    Birth control/protection: Post-menopausal  Other Topics Concern   Not on file  Social History Narrative   Drinks about 2 caffeine drinks a day    Social Drivers of Corporate investment banker Strain: Not on file  Food Insecurity: Not on file  Transportation Needs: Not on file  Physical Activity: Not on file  Stress: Not  on file  Social Connections: Not on file  Intimate Partner Violence: Not on file    Review of Systems: See HPI, otherwise negative ROS  Physical Exam: BP 130/75   Pulse 81   Temp 98.2 F (36.8 C) (Oral)   Resp 16   Ht 5\' 8"  (1.727 m)   Wt 81.6 kg   SpO2 98%   BMI 27.37 kg/m  General:   Alert,  Well-developed, well-nourished, pleasant and cooperative in NAD Neck:  Supple; no masses or thyromegaly. No significant cervical adenopathy. Lungs:  Clear throughout to auscultation.   No wheezes, crackles, or rhonchi. No acute distress. Heart:  Regular rate and rhythm; no murmurs, clicks, rubs,  or gallops. Abdomen: Non-distended, normal bowel sounds.  Soft and nontender without appreciable mass or hepatosplenomegaly.    Impression/Plan: 57 year old lady here for screening colonoscopy.  Chronically constipated.  Linzess worked  well.  Colonoscopy today per plan. The risks, benefits, limitations, alternatives and imponderables have been reviewed with the patient. Questions have been answered. All parties are agreeable.       Notice: This dictation was prepared with Dragon dictation along with smaller phrase technology. Any transcriptional errors that result from this process are unintentional and may not be corrected upon review.

## 2023-08-07 NOTE — Telephone Encounter (Signed)
-----   Message from Eula Listen sent at 08/07/2023  9:02 AM EST ----- Linzess 145 worked great.  New prescription Linzess 145.  Dispense 30 with 11 refills take 1

## 2023-08-07 NOTE — Transfer of Care (Signed)
Immediate Anesthesia Transfer of Care Note  Patient: Heather Davis  Procedure(s) Performed: COLONOSCOPY WITH PROPOFOL  Patient Location: Endoscopy Unit  Anesthesia Type:General  Level of Consciousness: awake, alert , and oriented  Airway & Oxygen Therapy: Patient Spontanous Breathing  Post-op Assessment: Report given to RN and Post -op Vital signs reviewed and stable  Post vital signs: Reviewed and stable  Last Vitals:  Vitals Value Taken Time  BP 90/57 08/07/23 0904  Temp 36.5 C 08/07/23 0904  Pulse 75 08/07/23 0904  Resp 13 08/07/23 0904  SpO2 97 % 08/07/23 0904    Last Pain:  Vitals:   08/07/23 0904  TempSrc: Oral  PainSc: 0-No pain      Patients Stated Pain Goal: 6 (08/07/23 0714)  Complications: No notable events documented.

## 2023-08-07 NOTE — Telephone Encounter (Signed)
Rx sent to pharmacy on file.

## 2023-08-07 NOTE — Anesthesia Preprocedure Evaluation (Signed)
Anesthesia Evaluation  Patient identified by MRN, date of birth, ID band Patient awake    Reviewed: Allergy & Precautions, H&P , NPO status , Patient's Chart, lab work & pertinent test results, reviewed documented beta blocker date and time   History of Anesthesia Complications (+) PONV and history of anesthetic complications  Airway Mallampati: II  TM Distance: >3 FB Neck ROM: full    Dental no notable dental hx.    Pulmonary neg pulmonary ROS, sleep apnea    Pulmonary exam normal breath sounds clear to auscultation       Cardiovascular Exercise Tolerance: Good hypertension, negative cardio ROS  Rhythm:regular Rate:Normal     Neuro/Psych  PSYCHIATRIC DISORDERS Anxiety Depression    negative neurological ROS  negative psych ROS   GI/Hepatic negative GI ROS, Neg liver ROS,,,  Endo/Other  negative endocrine ROS    Renal/GU negative Renal ROS  negative genitourinary   Musculoskeletal   Abdominal   Peds  Hematology negative hematology ROS (+)   Anesthesia Other Findings   Reproductive/Obstetrics negative OB ROS                             Anesthesia Physical Anesthesia Plan  ASA: 2  Anesthesia Plan: General   Post-op Pain Management:    Induction:   PONV Risk Score and Plan: Propofol infusion  Airway Management Planned:   Additional Equipment:   Intra-op Plan:   Post-operative Plan:   Informed Consent: I have reviewed the patients History and Physical, chart, labs and discussed the procedure including the risks, benefits and alternatives for the proposed anesthesia with the patient or authorized representative who has indicated his/her understanding and acceptance.     Dental Advisory Given  Plan Discussed with: CRNA  Anesthesia Plan Comments:        Anesthesia Quick Evaluation

## 2023-08-07 NOTE — Discharge Instructions (Signed)
  Colonoscopy Discharge Instructions  Read the instructions outlined below and refer to this sheet in the next few weeks. These discharge instructions provide you with general information on caring for yourself after you leave the hospital. Your doctor may also give you specific instructions. While your treatment has been planned according to the most current medical practices available, unavoidable complications occasionally occur. If you have any problems or questions after discharge, call Dr. Jena Gauss at [phone]. ACTIVITY You may resume your regular activity, but move at a slower pace for the next 24 hours.  Take frequent rest periods for the next 24 hours.  Walking will help get rid of the air and reduce the bloated feeling in your belly (abdomen).  No driving for 24 hours (because of the medicine (anesthesia) used during the test).   Do not sign any important legal documents or operate any machinery for 24 hours (because of the anesthesia used during the test).  NUTRITION Drink plenty of fluids.  You may resume your normal diet as instructed by your doctor.  Begin with a light meal and progress to your normal diet. Heavy or fried foods are harder to digest and may make you feel sick to your stomach (nauseated).  Avoid alcoholic beverages for 24 hours or as instructed.  MEDICATIONS You may resume your normal medications unless your doctor tells you otherwise.  WHAT YOU CAN EXPECT TODAY Some feelings of bloating in the abdomen.  Passage of more gas than usual.  Spotting of blood in your stool or on the toilet paper.  IF YOU HAD POLYPS REMOVED DURING THE COLONOSCOPY: No aspirin products for 7 days or as instructed.  No alcohol for 7 days or as instructed.  Eat a soft diet for the next 24 hours.  FINDING OUT THE RESULTS OF YOUR TEST Not all test results are available during your visit. If your test results are not back during the visit, make an appointment with your caregiver to find out the  results. Do not assume everything is normal if you have not heard from your caregiver or the medical facility. It is important for you to follow up on all of your test results.  SEEK IMMEDIATE MEDICAL ATTENTION IF: You have more than a spotting of blood in your stool.  Your belly is swollen (abdominal distention).  You are nauseated or vomiting.  You have a temperature over 101.  You have abdominal pain or discomfort that is severe or gets worse throughout the day.      your colonoscopy was normal today  It is recommended you return in 10 years for repeat colonoscopy   continue Linzess 145 daily and a new prescription has been sent to your pharmacy through the office   office visit with [place] in 1 year and as needed  At patient request, I called Nikita Ziomek at [phone] -

## 2023-08-07 NOTE — Op Note (Signed)
[facility] Patient Name: Heather Davis Procedure Date: 08/07/2023 8:12 AM MRN: [id number] Date of Birth: 1966-02-11 Attending MD: Gennette Pac , MD, [id number] CSN: [id number] Age: 57 Admit Type: Outpatient Procedure:                Colonoscopy Indications:              Screening for colorectal malignant neoplasm Providers:                Gennette Pac, MD, Angelica Ran, Pandora Leiter, Technician Referring MD:              Medicines:                Propofol per Anesthesia Complications:            No immediate complications. Estimated Blood Loss:     Estimated blood loss: none. Procedure:                Pre-Anesthesia Assessment:                           - Prior to the procedure, a History and Physical                            was performed, and patient medications and                            allergies were reviewed. The patient's tolerance of                            previous anesthesia was also reviewed. The risks                            and benefits of the procedure and the sedation                            options and risks were discussed with the patient.                            All questions were answered, and informed consent                            was obtained. Prior Anticoagulants: The patient has                            taken no anticoagulant or antiplatelet agents. ASA                            Grade Assessment: II - A patient with mild systemic                            disease. After reviewing the risks and benefits,  the patient was deemed in satisfactory condition to                            undergo the procedure.                           After obtaining informed consent, the colonoscope                            was passed under direct vision. Throughout the                            procedure, the patient's blood pressure, pulse, and                            oxygen  saturations were monitored continuously. The                            [id number]) scope was introduced through the                            anus and advanced to the the cecum, identified by                            appendiceal orifice and ileocecal valve. The                            colonoscopy was performed without difficulty. The                            patient tolerated the procedure well. The quality                            of the bowel preparation was adequate. The                            ileocecal valve, appendiceal orifice, and rectum                            were photographed. Scope In: 8:38:06 AM Scope Out: 8:57:25 AM Scope Withdrawal Time: 0 hours 7 minutes 49 seconds  Total Procedure Duration: 0 hours 19 minutes 19 seconds  Findings:      The perianal and digital rectal examinations were normal. Long redundant       colon requiring external abdominal pressure to reach the cecum.      The colon (entire examined portion) appeared normal.      The retroflexed view of the distal rectum and anal verge was normal and       showed no anal or rectal abnormalities. Estimated blood loss: none. Impression:               - The entire examined colon is normal (redundant).                           - The distal rectum and anal verge are normal  on                            retroflexion view.                           - No specimens collected. Moderate Sedation:      Moderate (conscious) sedation was personally administered by an       anesthesia professional. The following parameters were monitored: oxygen       saturation, heart rate, blood pressure, respiratory rate, EKG, adequacy       of pulmonary ventilation, and response to care. Recommendation:           - Patient has a contact number available for                            emergencies. The signs and symptoms of potential                            delayed complications were discussed with the                             patient. Return to normal activities tomorrow.                            Written discharge instructions were provided to the                            patient.                           - Advance diet as tolerated.                           - Continue present medications. Continue Linzess                            145 daily for constipation.                           - Repeat colonoscopy in 10 years for screening                            purposes.                           - Return to GI office in 1 year. Procedure Code(s):        --- Professional ---                           [phone], Colonoscopy, flexible; diagnostic, including                            collection of specimen(s) by brushing or washing,                            when performed (separate procedure) Diagnosis Code(s):        ---  Professional ---                           Z12.11, Encounter for screening for malignant                            neoplasm of colon CPT copyright 2022 American Medical Association. All rights reserved. The codes documented in this report are preliminary and upon coder review may  be revised to meet current compliance requirements. Gerrit Friends. [place], MD Gennette Pac, MD 08/07/2023 9:09:55 AM This report has been signed electronically. Number of Addenda: 0

## 2023-08-08 DIAGNOSIS — M47816 Spondylosis without myelopathy or radiculopathy, lumbar region: Secondary | ICD-10-CM | POA: Diagnosis not present

## 2023-08-08 DIAGNOSIS — Z1231 Encounter for screening mammogram for malignant neoplasm of breast: Secondary | ICD-10-CM | POA: Diagnosis not present

## 2023-08-08 NOTE — Anesthesia Postprocedure Evaluation (Signed)
Anesthesia Post Note  Patient: Heather Davis  Procedure(s) Performed: COLONOSCOPY WITH PROPOFOL  Patient location during evaluation: Phase II Anesthesia Type: General Level of consciousness: awake Pain management: pain level controlled Vital Signs Assessment: post-procedure vital signs reviewed and stable Respiratory status: spontaneous breathing and respiratory function stable Cardiovascular status: blood pressure returned to baseline and stable Postop Assessment: no headache and no apparent nausea or vomiting Anesthetic complications: no Comments: Late entry   No notable events documented.   Last Vitals:  Vitals:   08/07/23 0714 08/07/23 0904  BP: 130/75 (!) 90/57  Pulse: 81 75  Resp: 16 13  Temp: 36.8 C 36.5 C  SpO2: 98% 97%    Last Pain:  Vitals:   08/07/23 0904  TempSrc: Oral  PainSc: 0-No pain                 Windell Norfolk

## 2023-08-12 ENCOUNTER — Other Ambulatory Visit (HOSPITAL_COMMUNITY)
Admission: RE | Admit: 2023-08-12 | Discharge: 2023-08-12 | Disposition: A | Discharge status: Home / Self Care | Payer: Self-pay | Source: Ambulatory Visit | Attending: Medical Genetics | Admitting: Medical Genetics

## 2023-08-18 NOTE — Addendum Note (Signed)
Addendum  created 08/18/23 1052 by Lorin Glass, CRNA   Intraprocedure Staff edited

## 2023-08-25 LAB — GENECONNECT MOLECULAR SCREEN: Genetic Analysis Overall Interpretation: NEGATIVE

## 2023-08-26 NOTE — Addendum Note (Signed)
 Addendum  created 08/26/23 1013 by Cockfield, Gwendolyn Lima., CRNA   Attestation recorded in [place], Intraprocedure Attestations filed

## 2023-08-28 ENCOUNTER — Encounter (HOSPITAL_COMMUNITY): Payer: Self-pay | Admitting: Internal Medicine

## 2023-09-01 DIAGNOSIS — M47816 Spondylosis without myelopathy or radiculopathy, lumbar region: Secondary | ICD-10-CM | POA: Diagnosis not present

## 2023-10-07 DIAGNOSIS — M47816 Spondylosis without myelopathy or radiculopathy, lumbar region: Secondary | ICD-10-CM | POA: Diagnosis not present

## 2023-10-07 DIAGNOSIS — M4325 Fusion of spine, thoracolumbar region: Secondary | ICD-10-CM | POA: Diagnosis not present

## 2023-10-07 DIAGNOSIS — Z6827 Body mass index (BMI) 27.0-27.9, adult: Secondary | ICD-10-CM | POA: Diagnosis not present

## 2023-10-10 ENCOUNTER — Other Ambulatory Visit: Payer: Self-pay | Admitting: Orthopaedic Surgery

## 2023-10-10 DIAGNOSIS — G2581 Restless legs syndrome: Secondary | ICD-10-CM | POA: Diagnosis not present

## 2023-10-10 DIAGNOSIS — M4325 Fusion of spine, thoracolumbar region: Secondary | ICD-10-CM

## 2023-10-10 DIAGNOSIS — M47816 Spondylosis without myelopathy or radiculopathy, lumbar region: Secondary | ICD-10-CM

## 2023-10-15 DIAGNOSIS — M461 Sacroiliitis, not elsewhere classified: Secondary | ICD-10-CM | POA: Diagnosis not present

## 2023-11-05 ENCOUNTER — Ambulatory Visit
Admission: RE | Admit: 2023-11-05 | Discharge: 2023-11-05 | Disposition: A | Discharge status: Home / Self Care | Source: Ambulatory Visit | Attending: Orthopaedic Surgery | Admitting: Orthopaedic Surgery

## 2023-11-05 DIAGNOSIS — M4325 Fusion of spine, thoracolumbar region: Secondary | ICD-10-CM

## 2023-11-05 DIAGNOSIS — M47816 Spondylosis without myelopathy or radiculopathy, lumbar region: Secondary | ICD-10-CM

## 2023-11-05 DIAGNOSIS — M419 Scoliosis, unspecified: Secondary | ICD-10-CM | POA: Diagnosis not present

## 2023-11-05 DIAGNOSIS — M545 Low back pain, unspecified: Secondary | ICD-10-CM | POA: Diagnosis not present

## 2023-11-05 MED ORDER — GADOPICLENOL 0.5 MMOL/ML IV SOLN
8.0000 mL | Freq: Once | INTRAVENOUS | Status: AC | PRN
  Administered 2023-11-05: 8 mL via INTRAVENOUS

## 2023-11-12 DIAGNOSIS — M47816 Spondylosis without myelopathy or radiculopathy, lumbar region: Secondary | ICD-10-CM | POA: Diagnosis not present

## 2023-11-12 DIAGNOSIS — Z6827 Body mass index (BMI) 27.0-27.9, adult: Secondary | ICD-10-CM | POA: Diagnosis not present

## 2023-11-12 DIAGNOSIS — M461 Sacroiliitis, not elsewhere classified: Secondary | ICD-10-CM | POA: Diagnosis not present

## 2023-12-04 DIAGNOSIS — Z01810 Encounter for preprocedural cardiovascular examination: Secondary | ICD-10-CM | POA: Diagnosis not present

## 2023-12-04 DIAGNOSIS — M461 Sacroiliitis, not elsewhere classified: Secondary | ICD-10-CM | POA: Diagnosis not present

## 2023-12-08 DIAGNOSIS — M419 Scoliosis, unspecified: Secondary | ICD-10-CM | POA: Diagnosis not present

## 2023-12-08 DIAGNOSIS — M461 Sacroiliitis, not elsewhere classified: Secondary | ICD-10-CM | POA: Diagnosis not present

## 2023-12-08 DIAGNOSIS — M533 Sacrococcygeal disorders, not elsewhere classified: Secondary | ICD-10-CM | POA: Diagnosis not present

## 2023-12-08 DIAGNOSIS — E7800 Pure hypercholesterolemia, unspecified: Secondary | ICD-10-CM | POA: Diagnosis not present

## 2023-12-08 DIAGNOSIS — N1330 Unspecified hydronephrosis: Secondary | ICD-10-CM | POA: Diagnosis not present

## 2023-12-08 DIAGNOSIS — G4733 Obstructive sleep apnea (adult) (pediatric): Secondary | ICD-10-CM | POA: Diagnosis not present

## 2023-12-08 DIAGNOSIS — Z79899 Other long term (current) drug therapy: Secondary | ICD-10-CM | POA: Diagnosis not present

## 2023-12-25 DIAGNOSIS — M461 Sacroiliitis, not elsewhere classified: Secondary | ICD-10-CM | POA: Diagnosis not present

## 2023-12-25 DIAGNOSIS — Z1330 Encounter for screening examination for mental health and behavioral disorders, unspecified: Secondary | ICD-10-CM | POA: Diagnosis not present

## 2023-12-25 DIAGNOSIS — M533 Sacrococcygeal disorders, not elsewhere classified: Secondary | ICD-10-CM | POA: Diagnosis not present

## 2024-01-13 DIAGNOSIS — M545 Low back pain, unspecified: Secondary | ICD-10-CM | POA: Diagnosis not present

## 2024-01-13 DIAGNOSIS — M47896 Other spondylosis, lumbar region: Secondary | ICD-10-CM | POA: Diagnosis not present

## 2024-01-13 DIAGNOSIS — M461 Sacroiliitis, not elsewhere classified: Secondary | ICD-10-CM | POA: Diagnosis not present

## 2024-03-05 ENCOUNTER — Encounter: Payer: Self-pay | Admitting: Advanced Practice Midwife

## 2024-03-10 DIAGNOSIS — M461 Sacroiliitis, not elsewhere classified: Secondary | ICD-10-CM | POA: Diagnosis not present

## 2024-03-10 DIAGNOSIS — M47896 Other spondylosis, lumbar region: Secondary | ICD-10-CM | POA: Diagnosis not present

## 2024-05-19 DIAGNOSIS — R300 Dysuria: Secondary | ICD-10-CM | POA: Diagnosis not present

## 2024-05-19 DIAGNOSIS — N3090 Cystitis, unspecified without hematuria: Secondary | ICD-10-CM | POA: Diagnosis not present

## 2024-05-26 DIAGNOSIS — E785 Hyperlipidemia, unspecified: Secondary | ICD-10-CM | POA: Diagnosis not present

## 2024-06-04 DIAGNOSIS — Z1339 Encounter for screening examination for other mental health and behavioral disorders: Secondary | ICD-10-CM | POA: Diagnosis not present

## 2024-06-04 DIAGNOSIS — Z0000 Encounter for general adult medical examination without abnormal findings: Secondary | ICD-10-CM | POA: Diagnosis not present

## 2024-06-04 DIAGNOSIS — Z1331 Encounter for screening for depression: Secondary | ICD-10-CM | POA: Diagnosis not present

## 2024-06-04 DIAGNOSIS — Z8632 Personal history of gestational diabetes: Secondary | ICD-10-CM | POA: Diagnosis not present

## 2024-06-04 DIAGNOSIS — G2581 Restless legs syndrome: Secondary | ICD-10-CM | POA: Diagnosis not present

## 2024-07-07 ENCOUNTER — Encounter: Payer: Self-pay | Admitting: Internal Medicine

## 2024-07-26 DIAGNOSIS — D485 Neoplasm of uncertain behavior of skin: Secondary | ICD-10-CM | POA: Diagnosis not present

## 2024-07-26 DIAGNOSIS — D0471 Carcinoma in situ of skin of right lower limb, including hip: Secondary | ICD-10-CM | POA: Diagnosis not present

## 2024-07-26 DIAGNOSIS — L905 Scar conditions and fibrosis of skin: Secondary | ICD-10-CM | POA: Diagnosis not present

## 2024-07-26 DIAGNOSIS — L820 Inflamed seborrheic keratosis: Secondary | ICD-10-CM | POA: Diagnosis not present
